# Patient Record
Sex: Male | Born: 1965 | Race: White | Hispanic: No | Marital: Single | Smoking: Former smoker
Health system: Southern US, Community
[De-identification: ages and names within clinical notes are randomized; demographics above are authoritative.]

## PROBLEM LIST (undated history)

## (undated) DIAGNOSIS — G4733 Obstructive sleep apnea (adult) (pediatric): Secondary | ICD-10-CM

## (undated) DIAGNOSIS — I4892 Unspecified atrial flutter: Secondary | ICD-10-CM

## (undated) DIAGNOSIS — Z720 Tobacco use: Secondary | ICD-10-CM

## (undated) DIAGNOSIS — I10 Essential (primary) hypertension: Secondary | ICD-10-CM

## (undated) HISTORY — DX: Obstructive sleep apnea (adult) (pediatric): G47.33

## (undated) HISTORY — DX: Tobacco use: Z72.0

## (undated) HISTORY — PX: CHOLECYSTECTOMY: SHX55

## (undated) HISTORY — DX: Unspecified atrial flutter: I48.92

---

## 2001-02-17 ENCOUNTER — Ambulatory Visit (HOSPITAL_COMMUNITY): Admission: RE | Admit: 2001-02-17 | Discharge: 2001-02-17 | Payer: Self-pay | Admitting: Orthopedic Surgery

## 2001-02-17 ENCOUNTER — Encounter: Payer: Self-pay | Admitting: Orthopedic Surgery

## 2014-05-01 ENCOUNTER — Encounter (HOSPITAL_COMMUNITY): Payer: Self-pay | Admitting: Emergency Medicine

## 2014-05-01 ENCOUNTER — Emergency Department (HOSPITAL_COMMUNITY): Payer: BC Managed Care – PPO

## 2014-05-01 ENCOUNTER — Emergency Department (HOSPITAL_COMMUNITY)
Admission: EM | Admit: 2014-05-01 | Discharge: 2014-05-01 | Disposition: A | Discharge status: Home / Self Care | Payer: BC Managed Care – PPO | Attending: Emergency Medicine | Admitting: Emergency Medicine

## 2014-05-01 ENCOUNTER — Emergency Department (INDEPENDENT_AMBULATORY_CARE_PROVIDER_SITE_OTHER)
Admission: EM | Admit: 2014-05-01 | Discharge: 2014-05-01 | Disposition: A | Discharge status: Nursing Home (Includes ICF) | Payer: BC Managed Care – PPO | Source: Home / Self Care | Attending: Emergency Medicine | Admitting: Emergency Medicine

## 2014-05-01 DIAGNOSIS — F17200 Nicotine dependence, unspecified, uncomplicated: Secondary | ICD-10-CM | POA: Insufficient documentation

## 2014-05-01 DIAGNOSIS — R079 Chest pain, unspecified: Secondary | ICD-10-CM

## 2014-05-01 DIAGNOSIS — I10 Essential (primary) hypertension: Secondary | ICD-10-CM | POA: Insufficient documentation

## 2014-05-01 DIAGNOSIS — E669 Obesity, unspecified: Secondary | ICD-10-CM | POA: Insufficient documentation

## 2014-05-01 HISTORY — DX: Essential (primary) hypertension: I10

## 2014-05-01 LAB — CBC
HCT: 41.4 % (ref 39.0–52.0)
Hemoglobin: 14 g/dL (ref 13.0–17.0)
MCH: 29.7 pg (ref 26.0–34.0)
MCHC: 33.8 g/dL (ref 30.0–36.0)
MCV: 87.9 fL (ref 78.0–100.0)
Platelets: 205 10*3/uL (ref 150–400)
RBC: 4.71 MIL/uL (ref 4.22–5.81)
RDW: 12.8 % (ref 11.5–15.5)
WBC: 9.6 10*3/uL (ref 4.0–10.5)

## 2014-05-01 LAB — BASIC METABOLIC PANEL
BUN: 16 mg/dL (ref 6–23)
CO2: 25 mEq/L (ref 19–32)
Calcium: 9 mg/dL (ref 8.4–10.5)
Chloride: 102 mEq/L (ref 96–112)
Creatinine, Ser: 0.74 mg/dL (ref 0.50–1.35)
GFR calc non Af Amer: >90 mL/min (ref >90)
Glucose, Bld: 103 mg/dL — ABNORMAL HIGH (ref 70–99)
POTASSIUM: 4.2 meq/L (ref 3.7–5.3)
SODIUM: 140 meq/L (ref 137–147)

## 2014-05-01 LAB — I-STAT TROPONIN, ED: Troponin i, poc: 0 ng/mL (ref 0.00–0.08)

## 2014-05-01 LAB — TROPONIN I: Troponin I: <0.3 ng/mL (ref <0.30)

## 2014-05-01 MED ORDER — SODIUM CHLORIDE 0.9 % IV SOLN
INTRAVENOUS | Status: DC
  Administered 2014-05-01: 18:00:00 via INTRAVENOUS

## 2014-05-01 MED ORDER — NITROGLYCERIN 0.4 MG SL SUBL
0.4000 mg | SUBLINGUAL_TABLET | SUBLINGUAL | Status: AC | PRN
  Administered 2014-05-01: 0.4 mg via SUBLINGUAL

## 2014-05-01 MED ORDER — NITROGLYCERIN 0.4 MG SL SUBL
SUBLINGUAL_TABLET | SUBLINGUAL | Status: AC
  Filled 2014-05-01: qty 1

## 2014-05-01 MED ORDER — NITROGLYCERIN 0.4 MG SL SUBL
0.4000 mg | SUBLINGUAL_TABLET | SUBLINGUAL | Status: DC | PRN
  Administered 2014-05-01: 0.4 mg via SUBLINGUAL
  Filled 2014-05-01: qty 1

## 2014-05-01 MED ORDER — ASPIRIN 81 MG PO CHEW
162.0000 mg | CHEWABLE_TABLET | Freq: Once | ORAL | Status: AC
  Administered 2014-05-01: 162 mg via ORAL

## 2014-05-01 MED ORDER — ASPIRIN 81 MG PO CHEW
CHEWABLE_TABLET | ORAL | Status: AC
  Filled 2014-05-01: qty 2

## 2014-05-01 MED ORDER — MORPHINE SULFATE 4 MG/ML IJ SOLN
4.0000 mg | Freq: Once | INTRAMUSCULAR | Status: AC
  Administered 2014-05-01: 4 mg via INTRAVENOUS
  Filled 2014-05-01: qty 1

## 2014-05-01 NOTE — ED Provider Notes (Signed)
Chief Complaint   Chief Complaint  Patient presents with  . Chest Pain    History of Present Illness    Tim [name] is a 48 year old male with high blood pressure who has had left pectoral chest pain since 5 AM this morning. The pain has been constant and does not radiate. It feels like a tight, squeezing pain rated 3/10 in intensity it has not been accompanied by nausea, diaphoresis, or shortness of breath. Nothing makes it better or worse. The pain is not pleuritic. It's not worse with position, exertion, activity, or meals. He denies any fever, chills, headache, neck pain, upper back pain, coughing, wheezing, palpitations, dizziness, or syncope. He's had no abdominal pain, nausea, vomiting, or diarrhea. No reflux or indigestion. He's never had pain like this before. He has no history of cardiac disease. There is no family history of heart disease, no diabetes, hypercholesterolemia, or cigarette smoking. He has no history of leg pain or swelling and no history of DVT or pulmonary embolism.  Review of Systems    Other than noted above, the patient denies any of the following symptoms. Systemic:  No fever or chills. Pulmonary:  No cough, wheezing, shortness of breath, sputum production, hemoptysis. Cardiac:  No palpitations, rapid heartbeat, dizziness, presyncope or syncope. GI:  No abdominal pain, heartburn, nausea, or vomiting. Ext:  No leg pain or swelling.  PMFSH    Past medical history, family history, social history, meds, and allergies were reviewed. He has high blood pressure and takes Norvasc and lisinopril.  Physical Exam     Vital signs:  BP 150/93  Pulse 66  Temp(Src) 98.7 F (37.1 C) (Oral)  Resp 19  SpO2 98% Gen:  Alert, oriented, in no distress, skin warm and dry. Eye:  PERRL, lids and conjunctivas normal.  Sclera non-icteric. ENT:  Mucous membranes moist, pharynx clear. Neck:  Supple, no adenopathy or tenderness.  No JVD. Lungs:  Clear to auscultation, no  wheezes, rales or rhonchi.  No respiratory distress. Heart:  Regular rhythm.  No gallops, murmers, clicks or rubs. Chest:  No chest wall tenderness. Abdomen:  Soft, nontender, no organomegaly or mass.  Bowel sounds normal.  No pulsatile abdominal mass or bruit. Ext:  No edema.  No calf tenderness and Homann's sign negative.  Pulses full and equal. Skin:  Warm and dry.  No rash.  EKG Results:  Date: 05/01/2014  Rate: 63  Rhythm: normal sinus rhythm  QRS Axis: normal  Intervals: normal  ST/T Wave abnormalities: normal  Conduction Disutrbances:none  Narrative Interpretation: Normal sinus rhythm, normal EKG.  Old EKG Reviewed: none available    Course in Urgent Care Center         He was given nitroglycerin 0.4 mg sublingually, aspirin 162 mg (he had taken 2 baby aspirin at home), begun on IV normal saline, cardiac monitor, and nasal O2.  Assessment     The encounter diagnosis was Chest pain.  Differential diagnosis is acute coronary syndrome, pulmonary embolism, ruptured aneurysm, pneumothorax, Boerhaave syndrome, pericarditis, musculoskeletal pain, or reflux esophagitis.  Plan     The patient was transferred to the ED via [facility] in stable condition.  Medical Decision Making:  48 year old male with HT has had left pectoral chest pain since 5 a.m. This morning without radiation.  No associated nausea, diaphoresis or shortness of breath.  Not pleuritic.  No cardiac history.  EKG is normal.  Will start IV, give O2. TNG and ASA and transport via [facility].       Reuben Likesavid C Mateo Overbeck, MD 05/01/14 [id number]

## 2014-05-01 NOTE — ED Notes (Signed)
Patient complains of chest pain that started this morning.

## 2014-05-01 NOTE — Discharge Instructions (Signed)
Take aspirin 325 mg daily until you're cleared by cardiology. Call the cardiology office at [phone] tomorrow morning to arrange a stress test.  If you were given medicines take as directed.  If you are on coumadin or contraceptives realize their levels and effectiveness is altered by many different medicines.  If you have any reaction (rash, tongues swelling, other) to the medicines stop taking and see a physician.   Please follow up as directed and return to the ER or see a physician for new or worsening symptoms.  Thank you. Filed Vitals:   05/01/14 2000 05/01/14 2015 05/01/14 2030 05/01/14 2056  BP: 132/73 126/65 129/72 144/69  Pulse: 52 57 62 54  Temp:      TempSrc:      Resp: 14 13 12 14   SpO2: 98% 99% 97% 96%    Chest Pain (Nonspecific) It is often hard to give a specific diagnosis for the cause of chest pain. There is always a chance that your pain could be related to something serious, such as a heart attack or a blood clot in the lungs. You need to follow up with your caregiver for further evaluation. CAUSES   Heartburn.  Pneumonia or bronchitis.  Anxiety or stress.  Inflammation around your heart (pericarditis) or lung (pleuritis or pleurisy).  A blood clot in the lung.  A collapsed lung (pneumothorax). It can develop suddenly on its own (spontaneous pneumothorax) or from injury (trauma) to the chest.  Shingles infection (herpes zoster virus). The chest wall is composed of bones, muscles, and cartilage. Any of these can be the source of the pain.  The bones can be bruised by injury.  The muscles or cartilage can be strained by coughing or overwork.  The cartilage can be affected by inflammation and become sore (costochondritis). DIAGNOSIS  Lab tests or other studies, such as X-rays, electrocardiography, stress testing, or cardiac imaging, may be needed to find the cause of your pain.  TREATMENT   Treatment depends on what may be causing your chest pain. Treatment  may include:  Acid blockers for heartburn.  Anti-inflammatory medicine.  Pain medicine for inflammatory conditions.  Antibiotics if an infection is present.  You may be advised to change lifestyle habits. This includes stopping smoking and avoiding alcohol, caffeine, and chocolate.  You may be advised to keep your head raised (elevated) when sleeping. This reduces the chance of acid going backward from your stomach into your esophagus.  Most of the time, nonspecific chest pain will improve within 2 to 3 days with rest and mild pain medicine. HOME CARE INSTRUCTIONS   If antibiotics were prescribed, take your antibiotics as directed. Finish them even if you start to feel better.  For the next few days, avoid physical activities that bring on chest pain. Continue physical activities as directed.  Do not smoke.  Avoid drinking alcohol.  Only take over-the-counter or prescription medicine for pain, discomfort, or fever as directed by your caregiver.  Follow your caregiver's suggestions for further testing if your chest pain does not go away.  Keep any follow-up appointments you made. If you do not go to an appointment, you could develop lasting (chronic) problems with pain. If there is any problem keeping an appointment, you must call to reschedule. SEEK MEDICAL CARE IF:   You think you are having problems from the medicine you are taking. Read your medicine instructions carefully.  Your chest pain does not go away, even after treatment.  You develop a rash with  blisters on your chest. SEEK IMMEDIATE MEDICAL CARE IF:   You have increased chest pain or pain that spreads to your arm, neck, jaw, back, or abdomen.  You develop shortness of breath, an increasing cough, or you are coughing up blood.  You have severe back or abdominal pain, feel nauseous, or vomit.  You develop severe weakness, fainting, or chills.  You have a fever. THIS IS AN EMERGENCY. Do not wait to see if the  pain will go away. Get medical help at once. Call your local emergency services (911 in [place] Do not drive yourself to the hospital. MAKE SURE YOU:   Understand these instructions.  Will watch your condition.  Will get help right away if you are not doing well or get worse. Document Released: 08/14/2005 Document Revised: 01/27/2012 Document Reviewed: 06/09/2008 [facility] Patient Information 2014 [place], [place].

## 2014-05-01 NOTE — ED Notes (Signed)
Phlebotomy at bedside.

## 2014-05-01 NOTE — Discharge Instructions (Signed)
We have determined that your problem requires further evaluation in the emergency department.  We will take care of your transport there.  Once at the emergency department, you will be evaluated by a provider and they will order whatever treatment or tests they deem necessary.  We cannot guarantee that they will do any specific test or do any specific treatment.  ° °

## 2014-05-01 NOTE — ED Notes (Addendum)
Per EMS- [facility]/o substernal CP since 0500 this morning. No radiation or sob, or n/v. Given 324 aspirin, 1 nitro was painfree after. Currently reports 1/10 cp left sided. Pt is a x 4. Vss. EKG SR.

## 2014-05-01 NOTE — ED Provider Notes (Signed)
CSN: [id number]     Arrival date & time 05/01/14  1801 History   First MD Initiated Contact with Patient 05/01/14 1807     Chief Complaint  Patient presents with  . Chest Pain     (Consider location/radiation/quality/duration/timing/severity/associated sxs/prior Treatment) HPI Comments: 48 year old male who with history of smoking, high blood pressure and obesity presents with chest tightness fairly constant since this morning. Nothing specific worsens or improves his symptoms except nitroglycerin had mild improvement. Patient is very mild chest tightness currently. He denies any known cardiac history or recent stress test. No significant family history. Patient had aspirin and sent over from urgent care.Patient denies blood clot history, active cancer, recent major trauma or surgery, unilateral leg swelling/ pain, recent long travel, hemoptysis or oral contraceptives. No recent exertional symptoms or diaphoresis. Nonradiating anterior.  Patient is a 48 y.o. male presenting with chest pain. The history is provided by the patient.  Chest Pain Associated symptoms: no abdominal pain, no back pain, no cough, no fever, no headache, no shortness of breath and not vomiting     Past Medical History  Diagnosis Date  . Hypertension    Past Surgical History  Procedure Laterality Date  . Cholecystectomy     No family history on file. History  Substance Use Topics  . Smoking status: Light Tobacco Smoker    Types: Cigars  . Smokeless tobacco: Not on file  . Alcohol Use: Yes     Comment: occ    Review of Systems  Constitutional: Negative for fever and chills.  HENT: Negative for congestion.   Eyes: Negative for visual disturbance.  Respiratory: Negative for cough and shortness of breath.   Cardiovascular: Positive for chest pain.  Gastrointestinal: Negative for vomiting and abdominal pain.  Genitourinary: Negative for dysuria and flank pain.  Musculoskeletal: Negative for back pain, neck  pain and neck stiffness.  Skin: Negative for rash.  Neurological: Negative for light-headedness and headaches.      Allergies  Review of patient's allergies indicates no known allergies.  Home Medications   Prior to Admission medications   Not on File   BP 124/74  Pulse 65  Temp(Src) 98.4 F (36.9 C) (Oral)  Resp 22  SpO2 99% Physical Exam  Nursing note and vitals reviewed. Constitutional: He is oriented to person, place, and time. He appears well-developed and well-nourished.  HENT:  Head: Normocephalic and atraumatic.  Eyes: Conjunctivae are normal. Right eye exhibits no discharge. Left eye exhibits no discharge.  Neck: Normal range of motion. Neck supple. No tracheal deviation present.  Cardiovascular: Normal rate, regular rhythm and intact distal pulses.   Pulmonary/Chest: Effort normal and breath sounds normal.  Abdominal: Soft. He exhibits no distension. There is no tenderness. There is no guarding.  Musculoskeletal: He exhibits no edema and no tenderness.  Neurological: He is alert and oriented to person, place, and time.  Skin: Skin is warm. No rash noted.  Psychiatric: He has a normal mood and affect.    ED Course  Procedures (including critical care time) Labs Review Labs Reviewed  BASIC METABOLIC PANEL - Abnormal; Notable for the following:    Glucose, Bld 103 (*)    All other components within normal limits  CBC  TROPONIN I  TROPONIN I  [name], ED    Imaging Review Dg Chest 2 View  05/01/2014   CLINICAL DATA:  Left chest pain.  Hypertension.  EXAM: CHEST  2 VIEW  COMPARISON:  None.  FINDINGS: Mild peribronchial thickening.  Heart and mediastinal contours are within normal limits. No focal opacities or effusions. No acute bony abnormality.  IMPRESSION: Mild bronchitic changes.   Electronically Signed   By: Charlett NoseKevin  Dover M.D.   On: 05/01/2014 19:04     EKG Interpretation   Date/Time:  Sunday May 01 2014 18:06:59 EDT Ventricular Rate:   60 PR Interval:  162 QRS Duration: 95 QT Interval:  414 QTC Calculation: 414 R Axis:   28 Text Interpretation:  Sinus rhythm Low voltage, precordial leads Confirmed  by Talajah Slimp  MD, Tashia Leiterman ([id number]) on 05/01/2014 6:24:37 PM Also confirmed by  Jodi MourningZAVITZ  MD, Adylene Dlugosz ([id number])  on 05/01/2014 9:23:03 PM      MDM   Final diagnoses:  Chest pain     patient with 3 cardiac risk factors presents with chest tightness constant all day. Patient well-appearing with mild symptoms on exam plan for cardiac workup. Patient very low risk blood clot and I do not feel this workup indicated at this time. Discussed plan for observation hospital versus serial troponins in the ER and close follow for stress test the patient.    Patient's heart score is 3. I spoke with Dr. Aleene DavidsonGAN JI will see patient in the next 40 hours during stress test in reassessment. I discussed observation hospital versus close outpatient follow the patient and he prefers to follow closely outpatient and understands reasons return. Patient's capacity to make decisions. Patient's pain free on recheck and second troponin negative  Results and differential diagnosis were discussed with the patient/parent/guardian. Close follow up outpatient was discussed, comfortable with the plan.   Medications  nitroGLYCERIN (NITROSTAT) SL tablet 0.4 mg (0.4 mg Sublingual Given 05/01/14 1926)  morphine 4 MG/ML injection 4 mg (4 mg Intravenous Given 05/01/14 1842)    Filed Vitals:   05/01/14 2000 05/01/14 2015 05/01/14 2030 05/01/14 2056  BP: 132/73 126/65 129/72 144/69  Pulse: 52 57 62 54  Temp:      TempSrc:      Resp: 14 13 12 14   SpO2: 98% 99% 97% 96%       Enid SkeensJoshua M Kursten Kruk, MD 05/01/14 2134

## 2015-05-27 IMAGING — CR DG CHEST 2V
2 series · 2 of 2 positions shown · non-contrast
Comparison: None.

CLINICAL DATA: Left chest pain.  Hypertension.

EXAM:
CHEST  2 VIEW

[w chest pa]
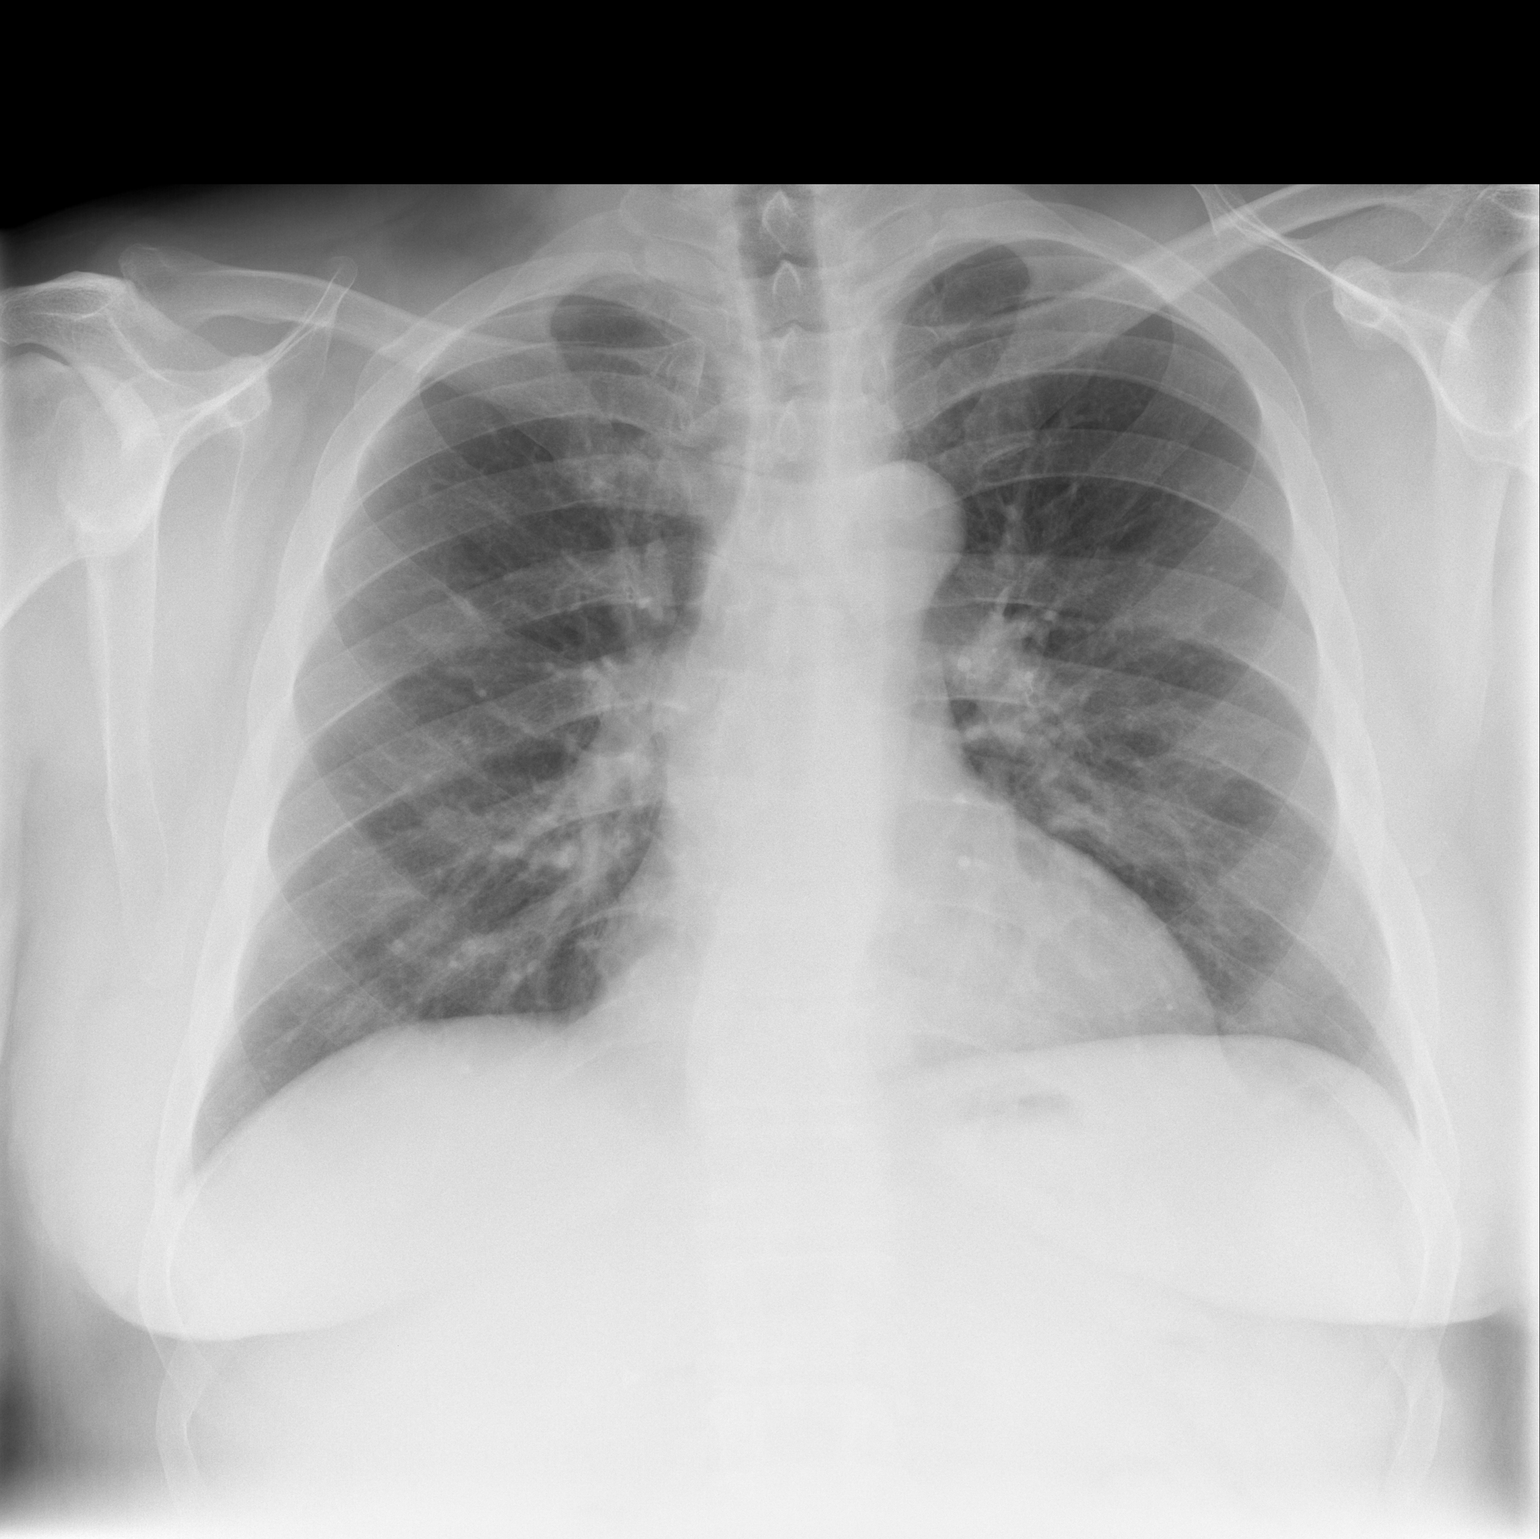

[w chest lat]
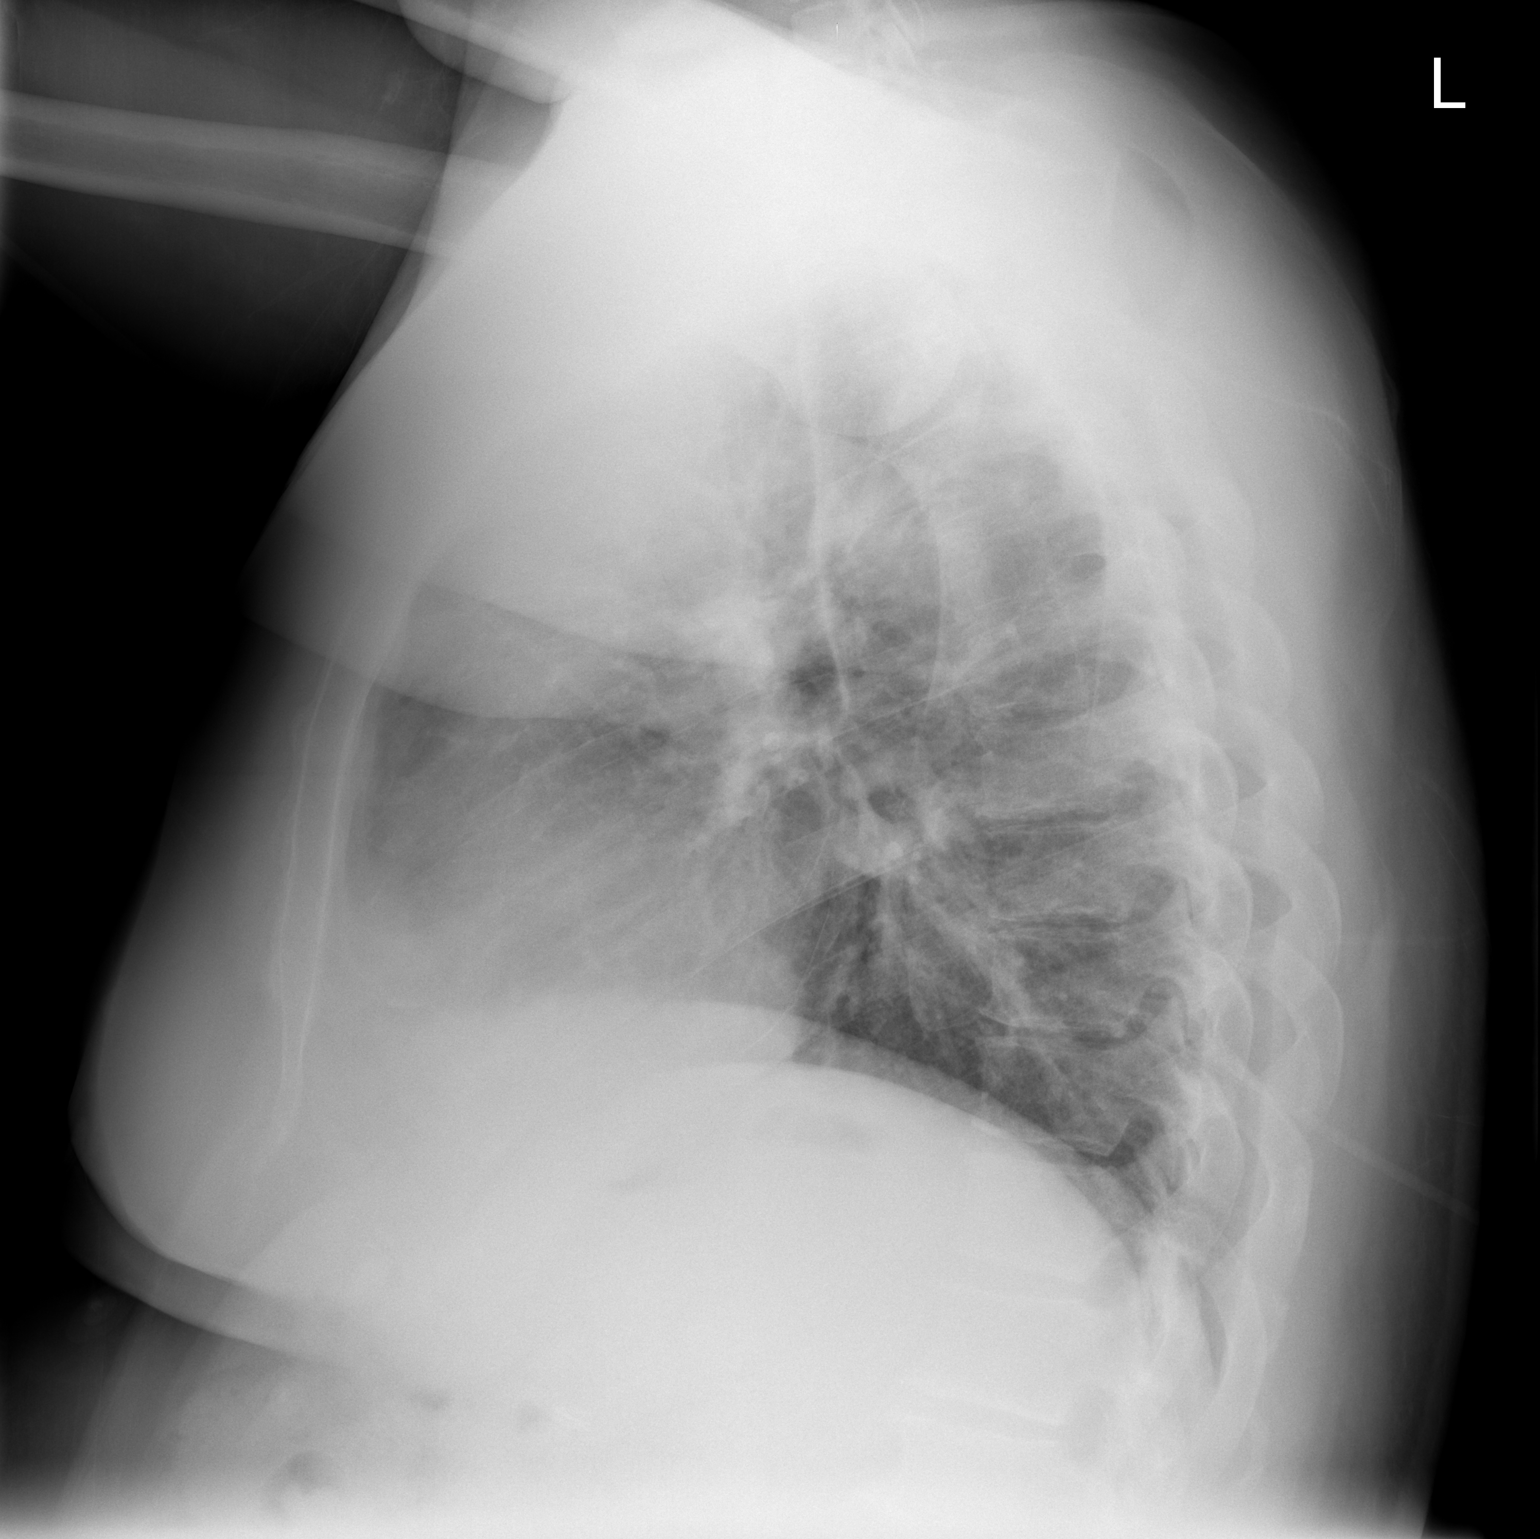

[2 of 2 positions shown; findings below may reference images not displayed]

FINDINGS: Mild peribronchial thickening. Heart and mediastinal contours are
within normal limits. No focal opacities or effusions. No acute bony
abnormality.
IMPRESSION: Mild bronchitic changes.

## 2019-12-12 DIAGNOSIS — G4733 Obstructive sleep apnea (adult) (pediatric): Secondary | ICD-10-CM | POA: Diagnosis present

## 2019-12-12 DIAGNOSIS — M5136 Other intervertebral disc degeneration, lumbar region: Secondary | ICD-10-CM | POA: Insufficient documentation

## 2023-04-08 ENCOUNTER — Other Ambulatory Visit: Payer: Self-pay

## 2023-04-08 ENCOUNTER — Observation Stay (HOSPITAL_COMMUNITY)
Admission: EM | Admit: 2023-04-08 | Discharge: 2023-04-09 | Disposition: A | Discharge status: Home / Self Care | Payer: 59 | Attending: Family Medicine | Admitting: Family Medicine

## 2023-04-08 ENCOUNTER — Emergency Department (HOSPITAL_COMMUNITY): Payer: 59

## 2023-04-08 ENCOUNTER — Encounter (HOSPITAL_COMMUNITY): Payer: Self-pay

## 2023-04-08 DIAGNOSIS — Z79899 Other long term (current) drug therapy: Secondary | ICD-10-CM | POA: Diagnosis not present

## 2023-04-08 DIAGNOSIS — Z1152 Encounter for screening for COVID-19: Secondary | ICD-10-CM | POA: Diagnosis not present

## 2023-04-08 DIAGNOSIS — I4891 Unspecified atrial fibrillation: Secondary | ICD-10-CM | POA: Diagnosis not present

## 2023-04-08 DIAGNOSIS — I10 Essential (primary) hypertension: Secondary | ICD-10-CM | POA: Insufficient documentation

## 2023-04-08 DIAGNOSIS — F17290 Nicotine dependence, other tobacco product, uncomplicated: Secondary | ICD-10-CM | POA: Insufficient documentation

## 2023-04-08 DIAGNOSIS — G4733 Obstructive sleep apnea (adult) (pediatric): Secondary | ICD-10-CM | POA: Diagnosis present

## 2023-04-08 LAB — CBC
HCT: 50 % (ref 39.0–52.0)
Hemoglobin: 17 g/dL (ref 13.0–17.0)
MCH: 30.9 pg (ref 26.0–34.0)
MCHC: 34 g/dL (ref 30.0–36.0)
MCV: 90.9 fL (ref 80.0–100.0)
Platelets: 260 10*3/uL (ref 150–400)
RBC: 5.5 MIL/uL (ref 4.22–5.81)
RDW: 12.6 % (ref 11.5–15.5)
WBC: 10.2 10*3/uL (ref 4.0–10.5)
nRBC: 0 % (ref 0.0–0.2)

## 2023-04-08 LAB — TROPONIN I (HIGH SENSITIVITY)
Troponin I (High Sensitivity): 14 ng/L (ref <18)
Troponin I (High Sensitivity): 16 ng/L (ref <18)

## 2023-04-08 LAB — BASIC METABOLIC PANEL
Anion gap: 14 (ref 5–15)
BUN: 20 mg/dL (ref 6–20)
CO2: 23 mmol/L (ref 22–32)
Calcium: 9.3 mg/dL (ref 8.9–10.3)
Chloride: 100 mmol/L (ref 98–111)
Creatinine, Ser: 1 mg/dL (ref 0.61–1.24)
GFR, Estimated: >60 mL/min (ref >60)
Glucose, Bld: 129 mg/dL — ABNORMAL HIGH (ref 70–99)
Potassium: 3.8 mmol/L (ref 3.5–5.1)
Sodium: 137 mmol/L (ref 135–145)

## 2023-04-08 LAB — SARS CORONAVIRUS 2 BY RT PCR: SARS Coronavirus 2 by RT PCR: NEGATIVE

## 2023-04-08 LAB — BRAIN NATRIURETIC PEPTIDE: B Natriuretic Peptide: 141.9 pg/mL — ABNORMAL HIGH (ref 0.0–100.0)

## 2023-04-08 MED ORDER — HEPARIN (PORCINE) 25000 UT/250ML-% IV SOLN
1800.0000 [IU]/h | INTRAVENOUS | Status: DC
  Administered 2023-04-08: 1600 [IU]/h via INTRAVENOUS
  Administered 2023-04-09: 1800 [IU]/h via INTRAVENOUS
  Filled 2023-04-08 (×2): qty 250

## 2023-04-08 MED ORDER — ASPIRIN 81 MG PO CHEW
324.0000 mg | CHEWABLE_TABLET | Freq: Once | ORAL | Status: AC
  Administered 2023-04-08: 324 mg via ORAL
  Filled 2023-04-08: qty 4

## 2023-04-08 MED ORDER — HEPARIN BOLUS VIA INFUSION
6000.0000 [IU] | Freq: Once | INTRAVENOUS | Status: AC
  Administered 2023-04-08: 6000 [IU] via INTRAVENOUS
  Filled 2023-04-08: qty 6000

## 2023-04-08 MED ORDER — DILTIAZEM HCL-DEXTROSE 125-5 MG/125ML-% IV SOLN (PREMIX)
5.0000 mg/h | INTRAVENOUS | Status: DC
  Administered 2023-04-08: 5 mg/h via INTRAVENOUS
  Filled 2023-04-08: qty 125

## 2023-04-08 MED ORDER — DILTIAZEM LOAD VIA INFUSION
20.0000 mg | Freq: Once | INTRAVENOUS | Status: AC
  Administered 2023-04-08: 20 mg via INTRAVENOUS
  Filled 2023-04-08: qty 20

## 2023-04-08 NOTE — ED Triage Notes (Signed)
Pt c/o midsternal chest pain and SOB started at 0900. Pt states his apple watch told him he was in A-fib and tachy at 156 and pt doesn't have a hx of it.

## 2023-04-08 NOTE — ED Provider Notes (Signed)
EMERGENCY DEPARTMENT AT [facility] Provider Note   CSN: [id number] Arrival date & time: 04/08/23  1741     History Chief Complaint  Patient presents with   Atrial Fibrillation    HPI Tim [name] is a 57 y.o. male presenting for 3 days of palpitations.  States that he has had palpitations in the past but they are typically not this severe.  Today they started causing this dyspnea sensation and shortness of breath as well as mild chest discomfort. Has had a workup for this and wore a heart monitor that was nondiagnostic.  Out in triage patient was tachycardic to the 150s brought back for emergent care.  Endorses a history of hypertension on amlodipine/lisinopril.   Patient's recorded medical, surgical, social, medication list and allergies were reviewed in the Snapshot window as part of the initial history.   Review of Systems   Review of Systems  Constitutional:  Negative for chills and fever.  HENT:  Negative for ear pain and sore throat.   Eyes:  Negative for pain and visual disturbance.  Respiratory:  Positive for shortness of breath. Negative for cough.   Cardiovascular:  Positive for palpitations. Negative for chest pain.  Gastrointestinal:  Negative for abdominal pain and vomiting.  Genitourinary:  Negative for dysuria and hematuria.  Musculoskeletal:  Negative for arthralgias and back pain.  Skin:  Negative for color change and rash.  Neurological:  Negative for seizures and syncope.  All other systems reviewed and are negative.   Physical Exam Updated Vital Signs BP 108/77   Pulse 68   Temp 97.7 F (36.5 C) (Oral)   Resp 15   Ht 6\' 3"  (1.905 m)   Wt 136.1 kg   SpO2 99%   BMI 37.50 kg/m  Physical Exam Vitals and nursing note reviewed.  Constitutional:      General: He is not in acute distress.    Appearance: He is well-developed.  HENT:     Head: Normocephalic and atraumatic.  Eyes:     Conjunctiva/sclera: Conjunctivae normal.   Cardiovascular:     Rate and Rhythm: Normal rate and regular rhythm.     Heart sounds: No murmur heard. Pulmonary:     Effort: Pulmonary effort is normal. No respiratory distress.     Breath sounds: Normal breath sounds.  Abdominal:     Palpations: Abdomen is soft.     Tenderness: There is no abdominal tenderness.  Musculoskeletal:        General: No swelling.     Cervical back: Neck supple.  Skin:    General: Skin is warm and dry.     Capillary Refill: Capillary refill takes less than 2 seconds.  Neurological:     Mental Status: He is alert.  Psychiatric:        Mood and Affect: Mood normal.      ED Course/ Medical Decision Making/ A&P    Procedures .Critical Care  Performed by: Glyn Ade, MD Authorized by: Glyn Ade, MD   Critical care provider statement:    Critical care time (minutes):  30   Critical care was necessary to treat or prevent imminent or life-threatening deterioration of the following conditions:  Cardiac failure   Critical care was time spent personally by me on the following activities:  Development of treatment plan with patient or surrogate, discussions with consultants, evaluation of patient's response to treatment, examination of patient, ordering and review of laboratory studies, ordering and review of radiographic studies, ordering  and performing treatments and interventions, pulse oximetry, re-evaluation of patient's condition and review of old charts   Care discussed with: admitting provider   Ultrasound ED Echo  Date/Time: 04/08/2023 6:05 PM  Performed by: Glyn Ade, MD Authorized by: Glyn Ade, MD   Procedure details:    Indications: chest pain     Views: subxiphoid, parasternal long axis view, parasternal short axis view and apical 4 chamber view     Images: archived     Limitations:  Body habitus, acoustic shadowing, patient compliance, positioning and increased thoracic air Findings:    LV Function:  normal (>50% EF)   Impression:    Impression: normal     Impression comment:  Very limited exam due to patient's obesity and difficulty of finding adequate windows, however brief glimpse that show a dynamic left ventricle.  No identified pericardial effusion.    Medications Ordered in ED Medications  diltiazem (CARDIZEM) 1 mg/mL load via infusion 20 mg (20 mg Intravenous Bolus from Bag 04/08/23 1815)    And  diltiazem (CARDIZEM) 125 mg in dextrose 5% 125 mL (1 mg/mL) infusion (5 mg/hr Intravenous New Bag/Given 04/08/23 1812)  heparin ADULT infusion 100 units/mL (25000 units/271mL) (1,600 Units/hr Intravenous New Bag/Given 04/08/23 1842)  aspirin chewable tablet 324 mg (324 mg Oral Given 04/08/23 1809)  heparin bolus via infusion 6,000 Units (6,000 Units Intravenous Bolus from Bag 04/08/23 1842)    Medical Decision Making:    [name] is a 57 y.o. male who presented to the ED today with lightheadedness detailed above.     Patient's presentation is complicated by their history of multiple comorbid medical problems.  Patient placed on continuous vitals and telemetry monitoring while in ED which was reviewed periodically.   Complete initial physical exam performed, notably the patient  was hemodynamically stable no acute distress.      Reviewed and confirmed nursing documentation for past medical history, family history, social history.    Initial Assessment:   Patient's history of present on his physicals and findings are concerning for atrial fibrillation with rapid ventricular response to the 150s.  He is having presyncope and palpitations. Consulted cardiology based on this presentation.  They agree with diltiazem infusion and heparinization due to possible need for cardioversion. Patient tolerated diltiazem initiation well.  Laboratory evaluation reveals no underlying metabolic or infectious etiology of his symptoms.  Will admit to medicine for further diagnostic care management and  titration over to oral agents.  Disposition:   Based on the above findings, I believe this patient is stable for admission.    Patient/family educated about specific findings on our evaluation and explained exact reasons for admission.  Patient/family educated about clinical situation and time was allowed to answer questions.   Admission team communicated with and agreed with need for admission. Patient admitted. Patient ready to move at this time.     Emergency Department Medication Summary:   Medications  diltiazem (CARDIZEM) 1 mg/mL load via infusion 20 mg (20 mg Intravenous Bolus from Bag 04/08/23 1815)    And  diltiazem (CARDIZEM) 125 mg in dextrose 5% 125 mL (1 mg/mL) infusion (5 mg/hr Intravenous New Bag/Given 04/08/23 1812)  heparin ADULT infusion 100 units/mL (25000 units/231mL) (1,600 Units/hr Intravenous New Bag/Given 04/08/23 1842)  aspirin chewable tablet 324 mg (324 mg Oral Given 04/08/23 1809)  heparin bolus via infusion 6,000 Units (6,000 Units Intravenous Bolus from Bag 04/08/23 1842)         Clinical Impression:  1. Atrial fibrillation  with RVR (HCC)      Admit   Final Clinical Impression(s) / ED Diagnoses Final diagnoses:  Atrial fibrillation with RVR [facility])    Rx / DC Orders ED Discharge Orders     None         Glyn Ade, MD 04/08/23 2316

## 2023-04-08 NOTE — Progress Notes (Signed)
ANTICOAGULATION CONSULT NOTE - Initial Consult  Pharmacy Consult for heparin Indication: atrial fibrillation  No Known Allergies  Patient Measurements: Height: 6\' 3"  (190.5 cm) Weight: 136.1 kg (300 lb) IBW/kg (Calculated) : 84.5 Heparin Dosing Weight: 115kg  Vital Signs: Temp: 99.1 F (37.3 C) (05/21 1749) BP: 128/83 (05/21 1749) Pulse Rate: 78 (05/21 1749)  Labs: No results for input(s): "HGB", "HCT", "PLT", "APTT", "LABPROT", "INR", "HEPARINUNFRC", "HEPRLOWMOCWT", "CREATININE", "CKTOTAL", "CKMB", "TROPONINIHS" in the last 72 hours.  CrCl cannot be calculated (Patient's most recent lab result is older than the maximum 21 days allowed.).   Medical History: Past Medical History:  Diagnosis Date   Hypertension      Assessment: 55 YOM presenting with CP and SOB, in Afib, he is not on anticoagulation PTA  Goal of Therapy:  Heparin level 0.3-0.7 units/ml Monitor platelets by anticoagulation protocol: Yes   Plan:  Heparin 6000 units IV x 1, and gtt at 1600 units/hr F/u 6 hour heparin level F/u long term [facility] plan  Daylene Posey, PharmD, [facility] Clinical Pharmacist ED Pharmacist Phone # [phone] 04/08/2023 6:22 PM

## 2023-04-08 NOTE — ED Notes (Signed)
ED TO INPATIENT HANDOFF REPORT  ED Nurse Name and Phone #:[phone]  S Name/Age/Gender Tim [name] 57 y.o. male Room/Bed: TRAAC/TRAAC  Code Status   Code Status: Full Code  Home/SNF/Other Home Patient oriented to: self, place, time, and situation Is this baseline? Yes   Triage Complete: Triage complete  Chief Complaint Atrial fibrillation with RVR (HCC) [I48.91]  Triage Note Pt c/o midsternal chest pain and SOB started at 0900. Pt states his apple watch told him he was in A-fib and tachy at 156 and pt doesn't have a hx of it.    Allergies No Known Allergies  Level of Care/Admitting Diagnosis ED Disposition     ED Disposition  Admit   Condition  --   Comment  Hospital Area: [facility] [[id number]]  Level of Care: Progressive [102]  Admit to Progressive based on following criteria: CARDIOVASCULAR & THORACIC of moderate stability with acute coronary syndrome symptoms/low risk myocardial infarction/hypertensive urgency/arrhythmias/heart failure potentially compromising stability and stable post cardiovascular intervention patients.  May admit patient to [facility] or [facility] if equivalent level of care is available:: No  Covid Evaluation: Asymptomatic - no recent exposure (last 10 days) testing not required  Diagnosis: Atrial fibrillation with RVR [facility]) [161096]  Admitting Physician: Buena Irish [[id number]]  Attending Physician: Buena Irish [phone]  Certification:: I certify this patient will need inpatient services for at least 2 midnights  Estimated Length of Stay: 2          B Medical/Surgery History Past Medical History:  Diagnosis Date   Hypertension    Past Surgical History:  Procedure Laterality Date   CHOLECYSTECTOMY       A IV Location/Drains/Wounds Patient Lines/Drains/Airways Status     Active Line/Drains/Airways     Name Placement date Placement time Site Days   Peripheral IV 04/08/23 18 G Left Antecubital 04/08/23   1805  Antecubital  less than 1   Peripheral IV 04/08/23 20 G Right Antecubital 04/08/23  1910  Antecubital  less than 1            Intake/Output Last 24 hours No intake or output data in the 24 hours ending 04/08/23 2020  Labs/Imaging Results for orders placed or performed during the hospital encounter of 04/08/23 (from the past 48 hour(s))  Basic metabolic panel     Status: Abnormal   Collection Time: 04/08/23  6:02 PM  Result Value Ref Range   Sodium 137 135 - 145 mmol/L   Potassium 3.8 3.5 - 5.1 mmol/L   Chloride 100 98 - 111 mmol/L   CO2 23 22 - 32 mmol/L   Glucose, Bld 129 (H) 70 - 99 mg/dL    Comment: Glucose reference range applies only to samples taken after fasting for at least 8 hours.   BUN 20 6 - 20 mg/dL   Creatinine, Ser 0.98 0.61 - 1.24 mg/dL   Calcium 9.3 8.9 - 11.9 mg/dL   GFR, Estimated >14 >78 mL/min    Comment: (NOTE) Calculated using the CKD-EPI Creatinine Equation (2021)    Anion gap 14 5 - 15    Comment: Performed at [facility], [address], [place], [place] [address]  CBC     Status: None   Collection Time: 04/08/23  6:02 PM  Result Value Ref Range   WBC 10.2 4.0 - 10.5 K/uL   RBC 5.50 4.22 - 5.81 MIL/uL   Hemoglobin 17.0 13.0 - 17.0 g/dL   HCT 13.0 86.5 - 78.4 %  MCV 90.9 80.0 - 100.0 fL   MCH 30.9 26.0 - 34.0 pg   MCHC 34.0 30.0 - 36.0 g/dL   RDW 16.1 09.6 - 04.5 %   Platelets 260 150 - 400 K/uL   nRBC 0.0 0.0 - 0.2 %    Comment: Performed at [facility], [address] [place], [place] [address]  Troponin I (High Sensitivity)     Status: None   Collection Time: 04/08/23  6:02 PM  Result Value Ref Range   Troponin I (High Sensitivity) 14 <18 ng/L    Comment: (NOTE) Elevated high sensitivity troponin I (hsTnI) values and significant  changes across serial measurements may suggest ACS but many other  chronic and acute conditions are known to elevate hsTnI results.  Refer to the "Links" section for chest pain algorithms  and additional  guidance. Performed at [facility], [address] [place], [place] [address]   Brain natriuretic peptide     Status: Abnormal   Collection Time: 04/08/23  6:02 PM  Result Value Ref Range   B Natriuretic Peptide 141.9 (H) 0.0 - 100.0 pg/mL    Comment: Performed at [facility], [address] [place], [place] [address]   DG Chest Port 1 View  Result Date: 04/08/2023 CLINICAL DATA:  Chest pain EXAM: PORTABLE CHEST 1 VIEW COMPARISON:  05/01/2014 x-ray series FINDINGS: No consolidation, pneumothorax or effusion. No edema. Normal cardiopericardial silhouette. Overlapping cardiac leads. Films are under penetrated. IMPRESSION: No acute cardiopulmonary disease Electronically Signed   By: Karen Kays M.D.   On: 04/08/2023 18:27    Pending Labs Unresulted Labs (From admission, onward)     Start     Ordered   04/10/23 0500  Heparin level (unfractionated)  Daily,   R     See Hyperspace for full Linked Orders Report.   04/08/23 1826   04/09/23 0500  CBC  Daily,   R     See Hyperspace for full Linked Orders Report.   04/08/23 1826   04/09/23 0500  TSH  Tomorrow morning,   R        04/08/23 2016   04/09/23 0500  Basic metabolic panel  Tomorrow morning,   R        04/08/23 2016   04/09/23 0500  Magnesium  Tomorrow morning,   R        04/08/23 2016   04/09/23 0100  Heparin level (unfractionated)  Once-Timed,   URGENT        04/08/23 1826   04/08/23 2013  HIV Antibody (routine testing w rflx)  (HIV Antibody (Routine testing w reflex) panel)  Once,   R        04/08/23 2016   04/08/23 1804  SARS Coronavirus 2 by RT PCR (hospital order, performed in [facility] hospital lab) *cepheid single result test* Anterior Nasal Swab  (Tier 2 - SARS Coronavirus 2 by RT PCR (hospital order, performed in [facility] hospital lab) *cepheid single result test*)  Once,   URGENT        04/08/23 1804            Vitals/Pain Today's Vitals   04/08/23 1930 04/08/23 1945 04/08/23  2000 04/08/23 2015  BP: (!) 120/92 119/86 110/72 114/87  Pulse: 80 83 75 83  Resp: 19 15 18 14   Temp:      SpO2: 97% 99% 97% 98%  Weight:      Height:  PainSc:        Isolation Precautions Airborne and Contact precautions  Medications Medications  diltiazem (CARDIZEM) 1 mg/mL load via infusion 20 mg (20 mg Intravenous Bolus from Bag 04/08/23 1815)    And  diltiazem (CARDIZEM) 125 mg in dextrose 5% 125 mL (1 mg/mL) infusion (5 mg/hr Intravenous New Bag/Given 04/08/23 1812)  heparin ADULT infusion 100 units/mL (25000 units/28mL) (1,600 Units/hr Intravenous New Bag/Given 04/08/23 1842)  aspirin chewable tablet 324 mg (324 mg Oral Given 04/08/23 1809)  heparin bolus via infusion 6,000 Units (6,000 Units Intravenous Bolus from Bag 04/08/23 1842)    Mobility walks     Focused Assessments Cardiac Assessment Handoff:    Lab Results  Component Value Date   TROPONINI <0.30 05/01/2014   No results found for: "DDIMER" Does the Patient currently have chest pain? No    R Recommendations: See Admitting Provider Note  Report given to:   Additional Notes:

## 2023-04-09 ENCOUNTER — Telehealth (HOSPITAL_COMMUNITY): Payer: Self-pay | Admitting: Pharmacy Technician

## 2023-04-09 ENCOUNTER — Inpatient Hospital Stay (HOSPITAL_BASED_OUTPATIENT_CLINIC_OR_DEPARTMENT_OTHER): Payer: 59

## 2023-04-09 ENCOUNTER — Other Ambulatory Visit (HOSPITAL_COMMUNITY): Payer: Self-pay

## 2023-04-09 DIAGNOSIS — I4891 Unspecified atrial fibrillation: Secondary | ICD-10-CM | POA: Diagnosis not present

## 2023-04-09 DIAGNOSIS — G4733 Obstructive sleep apnea (adult) (pediatric): Secondary | ICD-10-CM | POA: Diagnosis not present

## 2023-04-09 DIAGNOSIS — I10 Essential (primary) hypertension: Secondary | ICD-10-CM | POA: Diagnosis not present

## 2023-04-09 LAB — ECHOCARDIOGRAM COMPLETE
Area-P 1/2: 4 cm2
Height: 75 in
MV M vel: 2.42 m/s
MV Peak grad: 23.4 mmHg
MV VTI: 4.33 cm2
S' Lateral: 3.7 cm
Weight: 4800 oz

## 2023-04-09 LAB — BASIC METABOLIC PANEL
Anion gap: 10 (ref 5–15)
BUN: 20 mg/dL (ref 6–20)
CO2: 24 mmol/L (ref 22–32)
Calcium: 8.6 mg/dL — ABNORMAL LOW (ref 8.9–10.3)
Chloride: 104 mmol/L (ref 98–111)
Creatinine, Ser: 0.86 mg/dL (ref 0.61–1.24)
GFR, Estimated: >60 mL/min (ref >60)
Glucose, Bld: 172 mg/dL — ABNORMAL HIGH (ref 70–99)
Potassium: 3.3 mmol/L — ABNORMAL LOW (ref 3.5–5.1)
Sodium: 138 mmol/L (ref 135–145)

## 2023-04-09 LAB — CBC
HCT: 46.2 % (ref 39.0–52.0)
Hemoglobin: 15.5 g/dL (ref 13.0–17.0)
MCH: 30.3 pg (ref 26.0–34.0)
MCHC: 33.5 g/dL (ref 30.0–36.0)
MCV: 90.2 fL (ref 80.0–100.0)
Platelets: 212 10*3/uL (ref 150–400)
RBC: 5.12 MIL/uL (ref 4.22–5.81)
RDW: 12.7 % (ref 11.5–15.5)
WBC: 8.7 10*3/uL (ref 4.0–10.5)
nRBC: 0 % (ref 0.0–0.2)

## 2023-04-09 LAB — TROPONIN I (HIGH SENSITIVITY): Troponin I (High Sensitivity): 9 ng/L (ref <18)

## 2023-04-09 LAB — HIV ANTIBODY (ROUTINE TESTING W REFLEX): HIV Screen 4th Generation wRfx: NONREACTIVE

## 2023-04-09 LAB — TSH: TSH: 2.272 u[IU]/mL (ref 0.350–4.500)

## 2023-04-09 LAB — HEPARIN LEVEL (UNFRACTIONATED)
Heparin Unfractionated: 0.19 IU/mL — ABNORMAL LOW (ref 0.30–0.70)
Heparin Unfractionated: 0.44 IU/mL (ref 0.30–0.70)

## 2023-04-09 LAB — MAGNESIUM: Magnesium: 2.1 mg/dL (ref 1.7–2.4)

## 2023-04-09 MED ORDER — PERFLUTREN LIPID MICROSPHERE
1.0000 mL | INTRAVENOUS | Status: DC | PRN
  Administered 2023-04-09: 2 mL via INTRAVENOUS

## 2023-04-09 MED ORDER — APIXABAN 5 MG PO TABS
5.0000 mg | ORAL_TABLET | Freq: Two times a day (BID) | ORAL | 0 refills | Status: DC
  Filled 2023-04-09: qty 60, 30d supply, fill #0

## 2023-04-09 MED ORDER — CARVEDILOL 3.125 MG PO TABS
3.1250 mg | ORAL_TABLET | Freq: Two times a day (BID) | ORAL | 0 refills | Status: DC
  Filled 2023-04-09: qty 60, 30d supply, fill #0

## 2023-04-09 MED ORDER — CARVEDILOL 3.125 MG PO TABS: 3.1250 mg | ORAL_TABLET | Freq: Two times a day (BID) | ORAL | Status: DC

## 2023-04-09 MED ORDER — HEPARIN BOLUS VIA INFUSION
3000.0000 [IU] | Freq: Once | INTRAVENOUS | Status: AC
  Administered 2023-04-09: 3000 [IU] via INTRAVENOUS
  Filled 2023-04-09: qty 3000

## 2023-04-09 MED ORDER — APIXABAN 5 MG PO TABS
5.0000 mg | ORAL_TABLET | Freq: Two times a day (BID) | ORAL | Status: DC
  Administered 2023-04-09: 5 mg via ORAL
  Filled 2023-04-09: qty 1

## 2023-04-09 MED ORDER — CARVEDILOL 6.25 MG PO TABS
6.2500 mg | ORAL_TABLET | Freq: Two times a day (BID) | ORAL | Status: DC
  Administered 2023-04-09: 6.25 mg via ORAL
  Filled 2023-04-09: qty 1

## 2023-04-09 NOTE — Progress Notes (Signed)
ANTICOAGULATION CONSULT NOTE  Pharmacy Consult for heparin Indication: atrial fibrillation  No Known Allergies  Patient Measurements: Height: 6\' 3"  (190.5 cm) Weight: 136.1 kg (300 lb) IBW/kg (Calculated) : 84.5 Heparin Dosing Weight: 115kg  Vital Signs: Temp: 97.8 F (36.6 C) (05/22 0907) Temp Source: Oral (05/22 0907) BP: 117/80 (05/22 0907) Pulse Rate: 56 (05/22 0907)  Labs: Recent Labs    04/08/23 1802 04/08/23 2005 04/09/23 0114 04/09/23 0530 04/09/23 0948  HGB 17.0  --  15.5  --   --   HCT 50.0  --  46.2  --   --   PLT 260  --  212  --   --   HEPARINUNFRC  --   --  0.19*  --  0.44  CREATININE 1.00  --  0.86  --   --   TROPONINIHS 14 16  --  9  --      Estimated Creatinine Clearance: 140.9 mL/min (by C-G formula based on SCr of 0.86 mg/dL).   Medical History: Past Medical History:  Diagnosis Date   Hypertension      Assessment: 38 YOM presenting with CP and SOB, in Afib, he is not on anticoagulation PTA. Pharmacy dosing heparin   -heparin level at goal on 1800 units/hr, CBC stable  Goal of Therapy:  Heparin level 0.3-0.7 units/ml Monitor platelets by anticoagulation protocol: Yes   Plan:  -Continue heparin at 1800 units/hr -Confirm heparin level later today  Harland German, PharmD Clinical Pharmacist **Pharmacist phone directory can now be found on [email or web address] ([email or web address]).  Listed under [facility].

## 2023-04-09 NOTE — Telephone Encounter (Signed)
Pharmacy Patient Advocate Encounter  Insurance verification completed.    The patient is insured through [employer]   The patient is currently admitted and ran test claims for the following: Eliquis, Xarelto.  Copays and coinsurance results were relayed to Inpatient clinical team.      

## 2023-04-09 NOTE — Discharge Summary (Addendum)
Physician Discharge Summary  Tim [name] VWU:[id number] DOB: 1965/12/29 DOA: 04/08/2023  PCP: Pcp, No  Admit date: 04/08/2023 Discharge date: 04/09/2023 30 Day Unplanned Readmission Risk Score    Flowsheet Row ED to Hosp-Admission (Current) from 04/08/2023 in [place] [facility]  30 Day Unplanned Readmission Risk Score (%) 8.61 Filed at 04/09/2023 1201       This score is the patient's risk of an unplanned readmission within 30 days of being discharged (0 -100%). The score is based on dignosis, age, lab data, medications, orders, and past utilization.   Low:  0-14.9   Medium: 15-21.9   High: 22-29.9   Extreme: 30 and above          Admitted From: Home Disposition: Home  Recommendations for Outpatient Follow-up:  Follow up with PCP in 1-2 weeks Please obtain BMP/CBC in one week Follow-up with cardiology as outpatient-they will arrange. Please follow up with your PCP on the following pending results: Unresulted Labs (From admission, onward)     Start     Ordered   04/09/23 0500  CBC  Daily,   R     See Hyperspace for full Linked Orders Report.   04/08/23 1826              Home Health: None Equipment/Devices: None  Discharge Condition: Stable  CODE STATUS: Full code Diet recommendation: Cardiac  Subjective: Patient seen and examined.  No complaints.  Denies palpitation, chest pain or shortness of breath.  Eager to go home.  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  The patient does use his CPAP nightly as required by his job.  He does not sleep well at night because of back pain and leg cramps.  He came to the ED with the major complaint of heart racing and mild shortness of breath. When he arrived in the emergency department the patient's heart rate was greater than 150.  He was found to be in atrial fibrillation and was given a Cardizem bolus then  started on a Cardizem drip.  Admitted under hospital service, cardiology consulted.  He was started on heparin drip.  Overnight, he converted to sinus rhythm, diltiazem drip was stopped, he was started on Coreg.  He was seen by cardiology, they reduced his dose of Coreg due to an episode of bradycardia and started him on Eliquis and cleared him for discharge once echo is completed.  Echo was completed, results pending at the time of dictation.  They will follow-up with him as outpatient.  Regarding his hypertension, patient blood pressure has remained very well-controlled, he was taking amlodipine prior to admission.  Since he is going to be on Coreg, I have discontinued amlodipine.  Resume hydrochlorothiazide and lisinopril.  Addendum: Echo is resulted.  It shows 45 to 50% ejection fraction with global hypokinesis.  Normal diastolic function.  He is to follow with cardiology as outpatient.  Discharge Diagnoses:  Principal Problem:   Atrial fibrillation with rapid ventricular response (HCC) Active Problems:   OSA (obstructive sleep apnea)    Discharge Instructions   Allergies as of 04/09/2023   No Known Allergies      Medication List     STOP taking these medications    amLODipine 5 MG tablet Commonly known as: NORVASC       TAKE these medications    apixaban 5 MG Tabs tablet Commonly known as: ELIQUIS Take  1 tablet (5 mg total) by mouth 2 (two) times daily.   b complex vitamins tablet Take 1 tablet by mouth daily.   carvedilol 3.125 MG tablet Commonly known as: COREG Take 1 tablet (3.125 mg total) by mouth 2 (two) times daily with a meal.   cholecalciferol 1000 units tablet Commonly known as: VITAMIN D Take 1,000 Units by mouth daily.   dicyclomine 20 MG tablet Commonly known as: BENTYL Take 20 mg by mouth 2 (two) times daily.   lisinopril-hydrochlorothiazide 20-25 MG tablet Commonly known as: ZESTORETIC Take 1 tablet by mouth daily.   montelukast 10 MG  tablet Commonly known as: SINGULAIR Take 10 mg by mouth daily.   tamsulosin 0.4 MG Caps capsule Commonly known as: FLOMAX Take 0.4 mg by mouth at bedtime.   VITAMIN C PO Take 1 tablet by mouth daily.        Follow-up Information     PCP Follow up in 1 week(s).          [facility] Follow up.   Why: Please call this number for medical records. Contact information: Phone: [phone] Fax: [phone]               No Known Allergies  Consultations: Cardiology   Procedures/Studies: DG Chest Port 1 View  Result Date: 04/08/2023 CLINICAL DATA:  Chest pain EXAM: PORTABLE CHEST 1 VIEW COMPARISON:  05/01/2014 x-ray series FINDINGS: No consolidation, pneumothorax or effusion. No edema. Normal cardiopericardial silhouette. Overlapping cardiac leads. Films are under penetrated. IMPRESSION: No acute cardiopulmonary disease Electronically Signed   By: Karen Kays M.D.   On: 04/08/2023 18:27     Discharge Exam: Vitals:   04/09/23 0907 04/09/23 1210  BP: 117/80 124/89  Pulse: (!) 56 63  Resp: 18 17  Temp: 97.8 F (36.6 C)   SpO2: 98% 99%   Vitals:   04/09/23 0808 04/09/23 0810 04/09/23 0907 04/09/23 1210  BP: 111/73  117/80 124/89  Pulse: 61  (!) 56 63  Resp: 14  18 17   Temp:  98 F (36.7 C) 97.8 F (36.6 C)   TempSrc:  Oral Oral   SpO2: 99%  98% 99%  Weight:      Height:        General: Pt is alert, awake, not in acute distress Cardiovascular: RRR, S1/S2 +, no rubs, no gallops Respiratory: CTA bilaterally, no wheezing, no rhonchi Abdominal: Soft, NT, ND, bowel sounds + Extremities: no edema, no cyanosis    The results of significant diagnostics from this hospitalization (including imaging, microbiology, ancillary and laboratory) are listed below for reference.     Microbiology: Recent Results (from the past 240 hour(s))  SARS Coronavirus 2 by RT PCR (hospital order, performed in [facility] hospital lab) *cepheid single  result test* Anterior Nasal Swab     Status: None   Collection Time: 04/08/23  6:04 PM   Specimen: Anterior Nasal Swab  Result Value Ref Range Status   SARS Coronavirus 2 by RT PCR NEGATIVE NEGATIVE Final    Comment: Performed at [facility], [address], [place], [place] [address]     Labs: BNP (last 3 results) Recent Labs    04/08/23 1802  BNP 141.9*   Basic Metabolic Panel: Recent Labs  Lab 04/08/23 1802 04/09/23 0114  NA 137 138  K 3.8 3.3*  CL 100 104  CO2 23 24  GLUCOSE 129* 172*  BUN 20 20  CREATININE 1.00 0.86  CALCIUM 9.3  8.6*  MG  --  2.1   Liver Function Tests: No results for input(s): "AST", "ALT", "ALKPHOS", "BILITOT", "PROT", "ALBUMIN" in the last 168 hours. No results for input(s): "LIPASE", "AMYLASE" in the last 168 hours. No results for input(s): "AMMONIA" in the last 168 hours. CBC: Recent Labs  Lab 04/08/23 1802 04/09/23 0114  WBC 10.2 8.7  HGB 17.0 15.5  HCT 50.0 46.2  MCV 90.9 90.2  PLT 260 212   Cardiac Enzymes: No results for input(s): "CKTOTAL", "CKMB", "CKMBINDEX", "TROPONINI" in the last 168 hours. BNP: Invalid input(s): "POCBNP" CBG: No results for input(s): "GLUCAP" in the last 168 hours. D-Dimer No results for input(s): "DDIMER" in the last 72 hours. Hgb A1c No results for input(s): "HGBA1C" in the last 72 hours. Lipid Profile No results for input(s): "CHOL", "HDL", "LDLCALC", "TRIG", "CHOLHDL", "LDLDIRECT" in the last 72 hours. Thyroid function studies Recent Labs    04/09/23 0114  TSH 2.272   Anemia work up No results for input(s): "VITAMINB12", "FOLATE", "FERRITIN", "TIBC", "IRON", "RETICCTPCT" in the last 72 hours. Urinalysis No results found for: "COLORURINE", "APPEARANCEUR", "LABSPEC", "PHURINE", "GLUCOSEU", "HGBUR", "BILIRUBINUR", "KETONESUR", "PROTEINUR", "UROBILINOGEN", "NITRITE", "LEUKOCYTESUR" Sepsis Labs Recent Labs  Lab 04/08/23 1802 04/09/23 0114  WBC 10.2 8.7   Microbiology Recent Results  (from the past 240 hour(s))  SARS Coronavirus 2 by RT PCR (hospital order, performed in [facility] hospital lab) *cepheid single result test* Anterior Nasal Swab     Status: None   Collection Time: 04/08/23  6:04 PM   Specimen: Anterior Nasal Swab  Result Value Ref Range Status   SARS Coronavirus 2 by RT PCR NEGATIVE NEGATIVE Final    Comment: Performed at [facility], [address] [place], [place] [address]     Time coordinating discharge: Over 30 minutes  SIGNED:   Hughie Closs, MD  [facility] 04/09/2023, 2:35 PM *Please note that this is a verbal dictation therefore any spelling or grammatical errors are due to the "Dragon Medical One" system interpretation. If 7PM-7AM, please contact night-coverage [email or web address]

## 2023-04-09 NOTE — Discharge Instructions (Addendum)

## 2023-04-09 NOTE — Plan of Care (Signed)
Discharge instructions discussed with patient.  Patient instructed on home medications, restrictions, and follow up appointments. Belongings gathered and sent with patient.  Patients medications received from [facility] pharmacy.  Patient discharged via wheelchair by this Clinical research associate.

## 2023-04-09 NOTE — ED Notes (Signed)
ED TO INPATIENT HANDOFF REPORT  ED Nurse Name and Phone #: Vernona Rieger [phone]  R Name/Age/Gender Tim [name] 56 y.o. male Room/Bed: 004C/004C  Code Status   Code Status: Full Code  Home/SNF/Other Home Patient oriented to: self, place, time, and situation Is this baseline? Yes   Triage Complete: Triage complete  Chief Complaint Atrial fibrillation with RVR (HCC) [I48.91]  Triage Note Pt c/o midsternal chest pain and SOB started at 0900. Pt states his apple watch told him he was in A-fib and tachy at 156 and pt doesn't have a hx of it.    Allergies No Known Allergies  Level of Care/Admitting Diagnosis ED Disposition     ED Disposition  Admit   Condition  --   Comment  Hospital Area: [facility] [[id number]]  Level of Care: Progressive [102]  Admit to Progressive based on following criteria: CARDIOVASCULAR & THORACIC of moderate stability with acute coronary syndrome symptoms/low risk myocardial infarction/hypertensive urgency/arrhythmias/heart failure potentially compromising stability and stable post cardiovascular intervention patients.  May admit patient to [facility] or [facility] if equivalent level of care is available:: No  Covid Evaluation: Asymptomatic - no recent exposure (last 10 days) testing not required  Diagnosis: Atrial fibrillation with RVR [facility]) [[id number]]  Admitting Physician: Buena Irish [[id number]]  Attending Physician: Buena Irish [phone]  Certification:: I certify this patient will need inpatient services for at least 2 midnights  Estimated Length of Stay: 2          B Medical/Surgery History Past Medical History:  Diagnosis Date   Hypertension    Past Surgical History:  Procedure Laterality Date   CHOLECYSTECTOMY       A IV Location/Drains/Wounds Patient Lines/Drains/Airways Status     Active Line/Drains/Airways     Name Placement date Placement time Site Days   Peripheral IV 04/08/23 18 G Left Antecubital  04/08/23  1805  Antecubital  1   Peripheral IV 04/08/23 20 G Right Antecubital 04/08/23  1910  Antecubital  1            Intake/Output Last 24 hours  Intake/Output Summary (Last 24 hours) at 04/09/2023 0729 Last data filed at 04/09/2023 0008 Gross per 24 hour  Intake 29.67 ml  Output --  Net 29.67 ml    Labs/Imaging Results for orders placed or performed during the hospital encounter of 04/08/23 (from the past 48 hour(s))  Basic metabolic panel     Status: Abnormal   Collection Time: 04/08/23  6:02 PM  Result Value Ref Range   Sodium 137 135 - 145 mmol/L   Potassium 3.8 3.5 - 5.1 mmol/L   Chloride 100 98 - 111 mmol/L   CO2 23 22 - 32 mmol/L   Glucose, Bld 129 (H) 70 - 99 mg/dL    Comment: Glucose reference range applies only to samples taken after fasting for at least 8 hours.   BUN 20 6 - 20 mg/dL   Creatinine, Ser 9.14 0.61 - 1.24 mg/dL   Calcium 9.3 8.9 - 78.2 mg/dL   GFR, Estimated >95 >62 mL/min    Comment: (NOTE) Calculated using the CKD-EPI Creatinine Equation (2021)    Anion gap 14 5 - 15    Comment: Performed at [facility], [address], [place], [place] [address]  CBC     Status: None   Collection Time: 04/08/23  6:02 PM  Result Value Ref Range   WBC 10.2 4.0 - 10.5 K/uL   RBC 5.50 4.22 -  5.81 MIL/uL   Hemoglobin 17.0 13.0 - 17.0 g/dL   HCT 16.1 09.6 - 04.5 %   MCV 90.9 80.0 - 100.0 fL   MCH 30.9 26.0 - 34.0 pg   MCHC 34.0 30.0 - 36.0 g/dL   RDW 40.9 81.1 - 91.4 %   Platelets 260 150 - 400 K/uL   nRBC 0.0 0.0 - 0.2 %    Comment: Performed at [facility], [address] [place], [place] [address]  Troponin I (High Sensitivity)     Status: None   Collection Time: 04/08/23  6:02 PM  Result Value Ref Range   Troponin I (High Sensitivity) 14 <18 ng/L    Comment: (NOTE) Elevated high sensitivity troponin I (hsTnI) values and significant  changes across serial measurements may suggest ACS but many other  chronic and acute conditions are  known to elevate hsTnI results.  Refer to the "Links" section for chest pain algorithms and additional  guidance. Performed at [facility], [address], [place], [place] [address]   Brain natriuretic peptide     Status: Abnormal   Collection Time: 04/08/23  6:02 PM  Result Value Ref Range   B Natriuretic Peptide 141.9 (H) 0.0 - 100.0 pg/mL    Comment: Performed at [facility], [address] [place], [place] [address]  SARS Coronavirus 2 by RT PCR (hospital order, performed in [facility] hospital lab) *cepheid single result test* Anterior Nasal Swab     Status: None   Collection Time: 04/08/23  6:04 PM   Specimen: Anterior Nasal Swab  Result Value Ref Range   SARS Coronavirus 2 by RT PCR NEGATIVE NEGATIVE    Comment: Performed at [facility], [address], [place], [place] [address]  Troponin I (High Sensitivity)     Status: None   Collection Time: 04/08/23  8:05 PM  Result Value Ref Range   Troponin I (High Sensitivity) 16 <18 ng/L    Comment: (NOTE) Elevated high sensitivity troponin I (hsTnI) values and significant  changes across serial measurements may suggest ACS but many other  chronic and acute conditions are known to elevate hsTnI results.  Refer to the "Links" section for chest pain algorithms and additional  guidance. Performed at [facility], [address], [place], [place] [address]   Heparin level (unfractionated)     Status: Abnormal   Collection Time: 04/09/23  1:14 AM  Result Value Ref Range   Heparin Unfractionated 0.19 (L) 0.30 - 0.70 IU/mL    Comment: (NOTE) The clinical reportable range upper limit is being lowered to >1.10 to align with the FDA approved guidance for the current laboratory assay.  If heparin results are below expected values, and patient dosage has  been confirmed, suggest follow up testing of antithrombin III levels. Performed at [facility], [address] [place], [place] [address]   CBC      Status: None   Collection Time: 04/09/23  1:14 AM  Result Value Ref Range   WBC 8.7 4.0 - 10.5 K/uL   RBC 5.12 4.22 - 5.81 MIL/uL   Hemoglobin 15.5 13.0 - 17.0 g/dL   HCT 01.0 27.2 - 53.6 %   MCV 90.2 80.0 - 100.0 fL   MCH 30.3 26.0 - 34.0 pg   MCHC 33.5 30.0 - 36.0 g/dL   RDW 64.4 03.4 - 74.2 %   Platelets 212 150 - 400 K/uL   nRBC 0.0 0.0 -  0.2 %    Comment: Performed at [facility], [address], [place], [place] [address]  Basic metabolic panel     Status: Abnormal   Collection Time: 04/09/23  1:14 AM  Result Value Ref Range   Sodium 138 135 - 145 mmol/L   Potassium 3.3 (L) 3.5 - 5.1 mmol/L   Chloride 104 98 - 111 mmol/L   CO2 24 22 - 32 mmol/L   Glucose, Bld 172 (H) 70 - 99 mg/dL    Comment: Glucose reference range applies only to samples taken after fasting for at least 8 hours.   BUN 20 6 - 20 mg/dL   Creatinine, Ser 4.78 0.61 - 1.24 mg/dL   Calcium 8.6 (L) 8.9 - 10.3 mg/dL   GFR, Estimated >29 >56 mL/min    Comment: (NOTE) Calculated using the CKD-EPI Creatinine Equation (2021)    Anion gap 10 5 - 15    Comment: Performed at [facility], [address], [place], [place] [address]  HIV Antibody (routine testing w rflx)     Status: None   Collection Time: 04/09/23  1:14 AM  Result Value Ref Range   HIV Screen 4th Generation wRfx Non Reactive Non Reactive    Comment: Performed at [facility], [address] [place], [place] [address]  Magnesium     Status: None   Collection Time: 04/09/23  1:14 AM  Result Value Ref Range   Magnesium 2.1 1.7 - 2.4 mg/dL    Comment: Performed at [facility], [address] [place], [place] [address]  TSH     Status: None   Collection Time: 04/09/23  1:14 AM  Result Value Ref Range   TSH 2.272 0.350 - 4.500 uIU/mL    Comment: Performed by a 3rd Generation assay with a functional sensitivity of <=0.01 uIU/mL. Performed at [facility], [address], [place], [place] [address]   Troponin I  (High Sensitivity)     Status: None   Collection Time: 04/09/23  5:30 AM  Result Value Ref Range   Troponin I (High Sensitivity) 9 <18 ng/L    Comment: (NOTE) Elevated high sensitivity troponin I (hsTnI) values and significant  changes across serial measurements may suggest ACS but many other  chronic and acute conditions are known to elevate hsTnI results.  Refer to the "Links" section for chest pain algorithms and additional  guidance. Performed at [facility], [address] [place], [place] [address]    DG Chest Port 1 View  Result Date: 04/08/2023 CLINICAL DATA:  Chest pain EXAM: PORTABLE CHEST 1 VIEW COMPARISON:  05/01/2014 x-ray series FINDINGS: No consolidation, pneumothorax or effusion. No edema. Normal cardiopericardial silhouette. Overlapping cardiac leads. Films are under penetrated. IMPRESSION: No acute cardiopulmonary disease Electronically Signed   By: Karen Kays M.D.   On: 04/08/2023 18:27    Pending Labs Unresulted Labs (From admission, onward)     Start     Ordered   04/10/23 0500  Heparin level (unfractionated)  Daily,   R     See Hyperspace for full Linked Orders Report.   04/08/23 1826   04/09/23 1000  Heparin level (unfractionated)  Once-Timed,   TIMED        04/09/23 0216   04/09/23 0500  CBC  Daily,   R     See Hyperspace for full Linked Orders Report.   04/08/23 1826  Vitals/Pain Today's Vitals   04/09/23 0211 04/09/23 0300 04/09/23 0430 04/09/23 0625  BP:  117/73 103/82 107/73  Pulse:  63 71 63  Resp:  14 13 13   Temp: 97.7 F (36.5 C)   97.7 F (36.5 C)  TempSrc: Oral   Oral  SpO2:  96% 97% 99%  Weight:      Height:      PainSc:  Asleep      Isolation Precautions Airborne and Contact precautions  Medications Medications  diltiazem (CARDIZEM) 1 mg/mL load via infusion 20 mg (20 mg Intravenous Bolus from Bag 04/08/23 1815)    And  diltiazem (CARDIZEM) 125 mg in dextrose 5% 125 mL (1 mg/mL) infusion (0 mg/hr  Intravenous Stopped 04/09/23 0008)  heparin ADULT infusion 100 units/mL (25000 units/222mL) (1,800 Units/hr Intravenous New Bag/Given 04/09/23 0510)  carvedilol (COREG) tablet 6.25 mg (has no administration in time range)  aspirin chewable tablet 324 mg (324 mg Oral Given 04/08/23 1809)  heparin bolus via infusion 6,000 Units (6,000 Units Intravenous Bolus from Bag 04/08/23 1842)  heparin bolus via infusion 3,000 Units (3,000 Units Intravenous Bolus from Bag 04/09/23 0225)    Mobility walks     Focused Assessments Cardiac Assessment Handoff:  Cardiac Rhythm: Atrial fibrillation Lab Results  Component Value Date   TROPONINI <0.30 05/01/2014   No results found for: "DDIMER" Does the Patient currently have chest pain? No    R Recommendations: See Admitting Provider Note  Report given to:   Additional Notes:

## 2023-04-09 NOTE — H&P (Signed)
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  The patient does use his CPAP nightly as required by his job.  He does not sleep well at night because of back pain and leg cramps.  This morning at approximately 9:00 the patient felt his heart racing.  He describes some lightheadedness and very mild heaviness of his chest.  He was not significantly short of breath he did not feel any skipping or irregularity in his heartbeat per se.  It was not severe so he went out to work and was in another state by the time decided to check his Apple Watch.  His Apple Watch revealed that he was in atrial fibrillation.  Patient continued his driving route and when he got home at approximately 5:00 PM he came to the emergency department.  His symptoms continued with him all during the day but he was able to keep driving. When he arrived in the emergency department the patient's heart rate was greater than 150.  He was found to be in atrial fibrillation and was given a Cardizem bolus then started on a Cardizem drip which controlled his rate nicely. Cardiology was consulted by the ED provider.  IV heparin was recommended in case the patient needs to be cardioverted. The patient tells me that he had a negative treadmill stress test and what sounds like a Holter monitor by [facility] cardiology in October 2023.  He saw cardiology after his Apple Watch revealed that his heart rate was dropping in the 40s at night. Their evaluation found that everything was fine.  The patient has not had any trouble since that time.    Review of Systems: As mentioned in the history of  present illness. All other systems reviewed and are negative. Past Medical History:  Diagnosis Date   Hypertension    Past Surgical History:  Procedure Laterality Date   CHOLECYSTECTOMY     Social History:  reports that he has been smoking cigars. He does not have any smokeless tobacco history on file. He reports current alcohol use. He reports that he does not use drugs.  No Known Allergies  History reviewed. No pertinent family history.  Prior to Admission medications   Medication Sig Start Date End Date Taking? Authorizing Provider  amLODipine (NORVASC) 5 MG tablet Take 5 mg by mouth daily.  04/17/14   [provider]  azelastine (ASTELIN) 0.1 % nasal spray Place 2 sprays into both nostrils daily.  02/11/14   [provider]  b complex vitamins tablet Take 1 tablet by mouth daily.    [provider]  cholecalciferol (VITAMIN D) 1000 UNITS tablet Take 1,000 Units by mouth daily.    [provider]  GLUCOSAMINE-CHONDROITIN PO Take 2 tablets by mouth daily.    [provider]  ibuprofen (ADVIL,MOTRIN) 200 MG tablet Take 400 mg by mouth daily as needed (pain).    [provider]  lisinopril (PRINIVIL,ZESTRIL) 2.5 MG tablet Take 2.5 mg by mouth daily.    [provider]  Omega-3 Fatty Acids (FISH OIL PO) Take 1 capsule by mouth daily.    [provider]  Physical Exam: Vitals:   04/08/23 2203 04/08/23 2205 04/08/23 2228 04/08/23 2245  BP:    108/77  Pulse:  70 64 68  Resp:   17 15  Temp: 97.7 F (36.5 C)     TempSrc: Oral     SpO2:   99% 99%  Weight:      Height:       Physical Exam:  General: No acute distress, well developed, well nourished HEENT: Normocephalic, atraumatic, PERRL Cardiovascular: occasionally irregular, not tachy.  HR 70. Distal pulses intact. Pulmonary: Normal pulmonary effort, normal breath sounds Gastrointestinal: Nondistended abdomen, soft, non-tender, normoactive bowel sounds, no  organomegaly, obese Musculoskeletal:Normal ROM, no lower ext edema Lymphadenopathy: No cervical LAD. Skin: Skin is warm and dry. Neuro: No focal deficits noted, AAOx3. PSYCH: Attentive and cooperative  Data Reviewed:  Results for orders placed or performed during the hospital encounter of 04/08/23 (from the past 24 hour(s))  Basic metabolic panel     Status: Abnormal   Collection Time: 04/08/23  6:02 PM  Result Value Ref Range   Sodium 137 135 - 145 mmol/L   Potassium 3.8 3.5 - 5.1 mmol/L   Chloride 100 98 - 111 mmol/L   CO2 23 22 - 32 mmol/L   Glucose, Bld 129 (H) 70 - 99 mg/dL   BUN 20 6 - 20 mg/dL   Creatinine, Ser 1.61 0.61 - 1.24 mg/dL   Calcium 9.3 8.9 - 09.6 mg/dL   GFR, Estimated >04 >54 mL/min   Anion gap 14 5 - 15  CBC     Status: None   Collection Time: 04/08/23  6:02 PM  Result Value Ref Range   WBC 10.2 4.0 - 10.5 K/uL   RBC 5.50 4.22 - 5.81 MIL/uL   Hemoglobin 17.0 13.0 - 17.0 g/dL   HCT 09.8 11.9 - 14.7 %   MCV 90.9 80.0 - 100.0 fL   MCH 30.9 26.0 - 34.0 pg   MCHC 34.0 30.0 - 36.0 g/dL   RDW 82.9 56.2 - 13.0 %   Platelets 260 150 - 400 K/uL   nRBC 0.0 0.0 - 0.2 %  Troponin I (High Sensitivity)     Status: None   Collection Time: 04/08/23  6:02 PM  Result Value Ref Range   Troponin I (High Sensitivity) 14 <18 ng/L  Brain natriuretic peptide     Status: Abnormal   Collection Time: 04/08/23  6:02 PM  Result Value Ref Range   B Natriuretic Peptide 141.9 (H) 0.0 - 100.0 pg/mL  SARS Coronavirus 2 by RT PCR (hospital order, performed in [facility] lab) *cepheid single result test* Anterior Nasal Swab     Status: None   Collection Time: 04/08/23  6:04 PM   Specimen: Anterior Nasal Swab  Result Value Ref Range   SARS Coronavirus 2 by RT PCR NEGATIVE NEGATIVE  Troponin I (High Sensitivity)     Status: None   Collection Time: 04/08/23  8:05 PM  Result Value Ref Range   Troponin I (High Sensitivity) 16 <18 ng/L    Assessment and Plan: 1- New onset  atrial fibrillation with RVR - The patient's rate is currently controlled on a Cardizem drip - Cardiology has been consulted - He is on a heparin drip per their recommendation - Will order an echocardiogram and TSH - His troponins have been flat so far. Will order one more level  2- Obstructive sleep apnea - Continue nightly CPAP  3- hypertension -will hold his amlodipine and lisinopril as  his medications may need to change to something rate controlling.  His blood pressure is well-controlled on the Cardizem drip at present    Advance Care Planning:   Code Status: Full Code the patient names his significant other as his surrogate decision-maker.  If he ever has a full cardiac arrest outside of this hospitalization he wants to be DNR DNI.  For the purposes of this hospitalization he will be full code to participate in any cardiac procedures necessary.  Consults: [facility] cardiology  Family Communication: The patient's significant other was present during my evaluation.  All of her questions were answered.  Severity of Illness: The appropriate patient status for this patient is INPATIENT. Inpatient status is judged to be reasonable and necessary in order to provide the required intensity of service to ensure the patient's safety. The patient's presenting symptoms, physical exam findings, and initial radiographic and laboratory data in the context of their chronic comorbidities is felt to place them at high risk for further clinical deterioration. Furthermore, it is not anticipated that the patient will be medically stable for discharge from the hospital within 2 midnights of admission.   * I certify that at the point of admission it is my clinical judgment that the patient will require inpatient hospital care spanning beyond 2 midnights from the point of admission due to high intensity of service, high risk for further deterioration and high frequency of surveillance required.*  Author: Buena Irish, MD 04/09/2023 12:10 AM  For on call review [email or web address].

## 2023-04-09 NOTE — Progress Notes (Signed)
  Echocardiogram 2D Echocardiogram has been performed.  Tim [name] 04/09/2023, 2:38 PM

## 2023-04-09 NOTE — TOC Benefit Eligibility Note (Signed)
Patient Product/process development scientist completed.    The patient is currently admitted and upon discharge could be taking Elquis 5 mg.  The current 30 day co-pay is $110.00.   The patient is currently admitted and upon discharge could be taking Xarelto 20 mg.  The current 30 day co-pay is $110.00.   The patient is insured through [employer]   This test claim was processed through [employer]- copay amounts may vary at other pharmacies due to [employer], or as the patient moves through the different stages of their insurance plan.  Roland Earl, CPHT Pharmacy Patient Advocate Specialist [facility] Pharm[facility] Patient Advocate Team Direct Number: [phone]  Fax: [phone]

## 2023-04-09 NOTE — Consult Note (Addendum)
Cardiology Consultation   Patient ID: Tim [name] MRN: [id number]; DOB: [birth year]  Admit date: 04/08/2023 Date of Consult: 04/09/2023  PCP:  Aviva Kluver   [facility] Providers Cardiologist:  Nanetta Batty, MD      Patient Profile:   Tim [name] is a 57 y.o. [name] with a hx of HTN, and OSA who is being seen 04/09/2023 for the evaluation of new onset atrial flutter at the request of Dr. Gasper Sells.  History of Present Illness:   Tim [name] is a 57 yo [name] with PMH noted above. He has been seen by cardiology through [facility] 05/2022 and underwent work for chest pain with stress echo which was normal. Also wore a Holter monitor showing episodes of nocturnal bradycardia and junctional escape rhythm, and NSVT.   He is a Naval [employer]. Presented to the ED on 5/21 with concerns for atrial fibrillation. Reports that he developed palpitations and the feeling of his heart racing around 9am. Also had light-headedness and mild heaviness in his chest. Symptoms were not severe and he was able to go to work. Later that evening he check his apple watch and this reported he was in atrial fibrillation.   In the ED his labs showed sodium 137, potassium 3.8, creatinine 1, BNP 141, high-sensitivity troponin 14>> 16>> 9, WBC 10.2, hemoglobin 17, TSH 2.2.  EKG showed 2:1 atrial flutter, 155 bpm.  Chest x-ray negative.  He was placed on IV Cardizem with a bolus as well as IV heparin and admitted to internal medicine for further management.  Cardiology now asked to evaluate.  In talking with patient, he reports seldom tobacco use but does drink 12-18 beers on the weekend on a regular basis. Denies any family hx of Afib.    Past Medical History:  Diagnosis Date   Hypertension     Past Surgical History:  Procedure Laterality Date   CHOLECYSTECTOMY       Home Medications:  Prior to Admission medications   Medication Sig Start Date End Date Taking? Authorizing Provider  amLODipine (NORVASC) 5  MG tablet Take 5 mg by mouth daily.  04/17/14  Yes [provider]  Ascorbic Acid (VITAMIN C PO) Take 1 tablet by mouth daily.   Yes [provider]  b complex vitamins tablet Take 1 tablet by mouth daily.   Yes [provider]  cholecalciferol (VITAMIN D) 1000 UNITS tablet Take 1,000 Units by mouth daily.   Yes [provider]  dicyclomine (BENTYL) 20 MG tablet Take 20 mg by mouth 2 (two) times daily.   Yes [provider]  lisinopril-hydrochlorothiazide (ZESTORETIC) 20-25 MG tablet Take 1 tablet by mouth daily. 03/06/23  Yes [provider]  montelukast (SINGULAIR) 10 MG tablet Take 10 mg by mouth daily. 02/19/23  Yes [provider]  tamsulosin (FLOMAX) 0.4 MG CAPS capsule Take 0.4 mg by mouth at bedtime. 02/13/23  Yes [provider]    Inpatient Medications: Scheduled Meds:  apixaban  5 mg Oral BID   carvedilol  3.125 mg Oral BID WC   Continuous Infusions:  diltiazem (CARDIZEM) infusion Stopped (04/09/23 0008)   PRN Meds:   Allergies:   No Known Allergies  Social History:   Social History   Socioeconomic History   Marital status: Single    Spouse name: Not on file   Number of children: Not on file   Years of education: Not on file   Highest education level: Not on file  Occupational History  Not on file  Tobacco Use   Smoking status: Light Smoker    Types: Cigars   Smokeless tobacco: Not on file  Substance and Sexual Activity   Alcohol use: Yes    Comment: occ   Drug use: No   Sexual activity: Not on file  Other Topics Concern   Not on file  Social History Narrative   Not on file   Social Determinants of Health   Financial Resource Strain: Not on file  Food Insecurity: Not on file  Transportation Needs: Not on file  Physical Activity: Not on file  Stress: Not on file  Social Connections: Not on file  Intimate Partner Violence: Not on file    Family History:   History reviewed. No  pertinent family history.   ROS:  Please see the history of present illness.   All other ROS reviewed and negative.     Physical Exam/Data:   Vitals:   04/09/23 0808 04/09/23 0810 04/09/23 0907 04/09/23 1210  BP: 111/73  117/80 124/89  Pulse: 61  (!) 56 63  Resp: 14  18 17   Temp:  98 F (36.7 C) 97.8 F (36.6 C)   TempSrc:  Oral Oral   SpO2: 99%  98% 99%  Weight:      Height:        Intake/Output Summary (Last 24 hours) at 04/09/2023 1220 Last data filed at 04/09/2023 0008 Gross per 24 hour  Intake 29.67 ml  Output --  Net 29.67 ml      04/08/2023    5:46 PM  Last 3 Weights  Weight (lbs) 300 lb  Weight (kg) 136.079 kg     Body mass index is 37.5 kg/m.  General:  Well nourished, well developed, in no acute distress HEENT: normal Neck: no JVD Vascular: No carotid bruits; Distal pulses 2+ bilaterally Cardiac:  normal S1, S2; RRR; no murmur  Lungs:  clear to auscultation bilaterally, no wheezing, rhonchi or rales  Abd: soft, nontender, no hepatomegaly  Ext: no edema Musculoskeletal:  No deformities, BUE and BLE strength normal and equal Skin: warm and dry  Neuro:  CNs 2-12 intact, no focal abnormalities noted Psych:  Normal affect   EKG:  The EKG was personally reviewed and demonstrates:  2:1 atrial flutter, 155 bpm Telemetry:  Telemetry was personally reviewed and demonstrates:  sinus rhythm, some episodes of sinus bradycardia  Relevant CV Studies:  Echo: pending  Laboratory Data:  High Sensitivity Troponin:   Recent Labs  Lab 04/08/23 1802 04/08/23 2005 04/09/23 0530  TROPONINIHS 14 16 9      Chemistry Recent Labs  Lab 04/08/23 1802 04/09/23 0114  NA 137 138  K 3.8 3.3*  CL 100 104  CO2 23 24  GLUCOSE 129* 172*  BUN 20 20  CREATININE 1.00 0.86  CALCIUM 9.3 8.6*  MG  --  2.1  GFRNONAA >60 >60  ANIONGAP 14 10    No results for input(s): "PROT", "ALBUMIN", "AST", "ALT", "ALKPHOS", "BILITOT" in the last 168 hours. Lipids No results for  input(s): "CHOL", "TRIG", "HDL", "LABVLDL", "LDLCALC", "CHOLHDL" in the last 168 hours.  Hematology Recent Labs  Lab 04/08/23 1802 04/09/23 0114  WBC 10.2 8.7  RBC 5.50 5.12  HGB 17.0 15.5  HCT 50.0 46.2  MCV 90.9 90.2  MCH 30.9 30.3  MCHC 34.0 33.5  RDW 12.6 12.7  PLT 260 212   Thyroid  Recent Labs  Lab 04/09/23 0114  TSH 2.272    BNP Recent Labs  Lab 04/08/23 1802  BNP 141.9*    DDimer No results for input(s): "DDIMER" in the last 168 hours.   Radiology/Studies:  DG Chest Port 1 View  Result Date: 04/08/2023 CLINICAL DATA:  Chest pain EXAM: PORTABLE CHEST 1 VIEW COMPARISON:  05/01/2014 x-ray series FINDINGS: No consolidation, pneumothorax or effusion. No edema. Normal cardiopericardial silhouette. Overlapping cardiac leads. Films are under penetrated. IMPRESSION: No acute cardiopulmonary disease Electronically Signed   By: Karen Kays M.D.   On: 04/08/2023 18:27     Assessment and Plan:   Tim [name] is a 57 y.o. [name] with a hx of HTN, and OSA who is being seen 04/09/2023 for the evaluation of new onset atrial flutter at the request of Dr. Gasper Sells.  New onset atrial flutter -- developed episode yesterday morning around 9am that persisted throughout the day. Noted to be atrial flutter with RVR on arrival to the ED. Placed on IV Dilt gtt with conversion to sinus rhythm around midnight. Has been in sinus rhythm since that time.  -- ChadsVasc of 1, likely Eliquis 5mg  BID for 4 weeks then stop  -- echo pending  -- he uses his apple watch on a regular basis and aware of atrial flutter rhythm when present, therefore will defer cardiac monitor at this time   HTN -- has been on amlodipine and lisinopril PTA -- initially placed on IV Dilt, converted to SR. Has been transitioned to coreg 6.25mg  BID. Has had some episodes of bradycardia, reduce dose to 3.125mg  BID   OSA -- reports compliance with Cpap  Tobacco Use ETOH use -- discussed the need for reduction in ETOH  consumption   He prefers to follow up with Heartcare, will arrange for follow up.   Risk Assessment/Risk Scores:   CHA2DS2-VASc Score = 1  This indicates a 0.6% annual risk of stroke. 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  He was admitted with a flutter with rapid ventricular response which he was symptomatic from.  He had previously been evaluated at [facility] and had a normal stress echo.  He also has obstructive sleep apnea on CPAP.  He was converted to sinus rhythm on IV diltiazem.  2D echo is pending.  He feels clinically improved.  Does admit to alcohol usage on the weekends which we have discussed as a trigger for atrial tachyarrhythmias.  His exam is benign. The CHA2DSVASC2 score is 1  .  I do not think he needs long-term oral anticoagulation.  Will put him on Eliquis 5 mg p.o. twice daily for 1 month after which she can discontinue this.  Once his echo is complete and if it is normal he can be discharged home.  Will arrange outpatient follow-up.  Runell Gess, M.D., FACP, [facility], [facility] [address] [place], [place]  [address]  [phone] 04/09/2023 12:50 PM

## 2023-04-09 NOTE — Progress Notes (Signed)
ANTICOAGULATION CONSULT NOTE  Pharmacy Consult for heparin Indication: atrial fibrillation  No Known Allergies  Patient Measurements: Height: 6\' 3"  (190.5 cm) Weight: 136.1 kg (300 lb) IBW/kg (Calculated) : 84.5 Heparin Dosing Weight: 115kg  Vital Signs: Temp: 97.7 F (36.5 C) (05/22 0211) Temp Source: Oral (05/22 0211) BP: 94/58 (05/22 0157) Pulse Rate: 61 (05/22 0157)  Labs: Recent Labs    04/08/23 1802 04/08/23 2005 04/09/23 0114  HGB 17.0  --  15.5  HCT 50.0  --  46.2  PLT 260  --  212  HEPARINUNFRC  --   --  0.19*  CREATININE 1.00  --  0.86  TROPONINIHS 14 16  --     Estimated Creatinine Clearance: 140.9 mL/min (by C-G formula based on SCr of 0.86 mg/dL).   Medical History: Past Medical History:  Diagnosis Date   Hypertension      Assessment: 67 YOM presenting with CP and SOB, in Afib, he is not on anticoagulation PTA  5/22 AM update:  Heparin level sub-therapeutic   Goal of Therapy:  Heparin level 0.3-0.7 units/ml Monitor platelets by anticoagulation protocol: Yes   Plan:  Heparin 3000 units re-bolus Inc heparin to 1800 units/hr 1000 heparin level  Abran Duke, PharmD, BCPS Clinical Pharmacist Phone: [phone]

## 2023-04-10 ENCOUNTER — Encounter: Payer: Self-pay | Admitting: Student

## 2023-04-10 NOTE — Progress Notes (Signed)
Cardiology Office Note:    Date:  04/17/2023   ID:  Tim [name], DOB May 26, 1966, MRN [id number]  PCP:  Venetia Constable, MD  Cardiologist:  Nanetta Batty, MD  Electrophysiologist:  None   Referring MD: No ref. provider found   Chief Complaint: hospital follow-up of atrial flutter  History of Present Illness:    Tim [name] is a 57 y.o. male with a history of paroxysmal atrial flutter on Eliquis, hypertension, obstructive sleep apnea,  obesity, tobacco abuse, and alcohol abuse who is followed by Dr. Allyson Sabal and presents today for hospital follow-up of atrial flutter.  Patient was first seen by Cardiology during recent admission. He was admitted from 04/08/2023 to 04/09/2023 for new onset atrial flutter with RVR. He was placed on IV Diltiazem and converted to sinus rhythm. Echo showed LVEF of 45-50% with global hypokinesis and mild dilatation of the ascending aorta measuring 40 mm. He was started on Eliquis with plans to continue this for one month and then stop given low CHA2DS2-VASc score of 1 (HTN). He was counseled on the need to reduce alcohol consumption as this can trigger tachyarrhythmias.   Patient presents today for follow-up. He denies any recurrent palpitations or recurrent notification of an abnormal rhythm on his Apple Watch. He reports occasional episodes where it feels like he cannot take a deep breath and then he develops a very mild "weight" on his chest because he feels like he cannot breathe. He states that he has had this since he had COVID. His PCP gave him an inhaler and symptoms resolve with this. No other significant shortness of breath or chest pain/ discomfort. This does not sound like angina - it sounds pulmonary in nature. His PCP gave inhaler, No orthopnea, PND, edema, lightheadedness, dizziness, edema. No abnormal bleeding on Eliquis. His only real complaint today is diffuse stiffness and soreness of his back after Meloxicam was stopped in the hospital due to  need for Eliquis. We discussed the reasoning for this and he understands.   Past Medical History:  Diagnosis Date   Atrial flutter (HCC)    Hypertension    Obstructive sleep apnea    Tobacco abuse     Past Surgical History:  Procedure Laterality Date   CHOLECYSTECTOMY      Current Medications: Current Meds  Medication Sig   apixaban (ELIQUIS) 5 MG TABS tablet Take 1 tablet (5 mg total) by mouth 2 (two) times daily.   Ascorbic Acid (VITAMIN C PO) Take 1 tablet by mouth daily.   b complex vitamins tablet Take 1 tablet by mouth daily.   Budeson-Glycopyrrol-Formoterol (BREZTRI AEROSPHERE) 160-9-4.8 MCG/ACT AERO Inhale into the lungs as needed.   carvedilol (COREG) 3.125 MG tablet Take 1 tablet (3.125 mg total) by mouth 2 (two) times daily with a meal.   cholecalciferol (VITAMIN D) 1000 UNITS tablet Take 1,000 Units by mouth daily.   dicyclomine (BENTYL) 20 MG tablet Take 20 mg by mouth 2 (two) times daily.   lisinopril-hydrochlorothiazide (ZESTORETIC) 20-25 MG tablet Take 1 tablet by mouth daily.   montelukast (SINGULAIR) 10 MG tablet Take 10 mg by mouth daily.   sildenafil (REVATIO) 20 MG tablet Take 20 mg by mouth 3 (three) times daily as needed.   tamsulosin (FLOMAX) 0.4 MG CAPS capsule Take 0.4 mg by mouth at bedtime.   testosterone cypionate (DEPOTESTOSTERONE CYPIONATE) 200 MG/ML injection Inject 200 mg into the muscle as needed. PATIENT INJECTS 1/2 ML EVERY 10 DAYS     Allergies:  Patient has no known allergies.   Social History   Socioeconomic History   Marital status: Single    Spouse name: Not on file   Number of children: Not on file   Years of education: Not on file   Highest education level: Not on file  Occupational History   Not on file  Tobacco Use   Smoking status: Light Smoker    Types: Cigars   Smokeless tobacco: Not on file  Substance and Sexual Activity   Alcohol use: Yes    Comment: occ   Drug use: No   Sexual activity: Not on file  Other Topics  Concern   Not on file  Social History Narrative   Not on file   Social Determinants of Health   Financial Resource Strain: Not on file  Food Insecurity: Not on file  Transportation Needs: Not on file  Physical Activity: Not on file  Stress: Not on file  Social Connections: Not on file     Family History: The patient's family history is not on file.  ROS:   Please see the history of present illness.     EKGs/Labs/Other Studies Reviewed:    The following studies were reviewed:  Echocardiogram 04/09/2023: Impressions: 1. Left ventricular ejection fraction, by estimation, is 45 to 50%. The  left ventricle has mildly decreased function. The left ventricle  demonstrates global hypokinesis. There is mild left ventricular  hypertrophy. Left ventricular diastolic parameters  were normal.   2. Right ventricular systolic function is normal. The right ventricular  size is mildly enlarged.   3. The mitral valve is normal in structure. Trivial mitral valve  regurgitation. No evidence of mitral stenosis.   4. The aortic valve is normal in structure. Aortic valve regurgitation is  not visualized. No aortic stenosis is present.   5. Aortic dilatation noted. There is mild dilatation of the ascending  aorta, measuring 40 mm.   6. The inferior vena cava is normal in size with greater than 50%  respiratory variability, suggesting right atrial pressure of 3 mmHg.    EKG:  EKG ordered today. EKG personally reviewed and demonstrates sinus bradycardia, rate 46 bpm, with no acute ST/T changes. Normal axis. Normal PR and QRS intervals. QTc 374. Marland Kitchen  Recent Labs: 04/08/2023: B Natriuretic Peptide 141.9 04/09/2023: BUN 20; Creatinine, Ser 0.86; Hemoglobin 15.5; Magnesium 2.1; Platelets 212; Potassium 3.3; Sodium 138; TSH 2.272  Recent Lipid Panel No results found for: "CHOL", "TRIG", "HDL", "CHOLHDL", "VLDL", "LDLCALC", "LDLDIRECT"  Physical Exam:    Vital Signs: BP 128/88 (BP Location: Right  Arm, Patient Position: Sitting, Cuff Size: Large)   Pulse (!) 54   Ht 6\' 3"  (1.905 m)   Wt (!) 317 lb 9.6 oz (144.1 kg)   SpO2 97%   BMI 39.70 kg/m     Wt Readings from Last 3 Encounters:  04/17/23 (!) 317 lb 9.6 oz (144.1 kg)  04/08/23 300 lb (136.1 kg)     General: 57 y.o. Caucasian male in no acute distress. HEENT: Normocephalic and atraumatic. Sclera clear.  Neck: Supple. No JVD. Heart: Bradycardic with normal rhythm.  Distinct S1 and S2. No murmurs, gallops, or rubs. Radial and distal pedal pulses 2+ and equal bilaterally. Lungs: No increased work of breathing. Clear to ausculation bilaterally. No wheezes, rhonchi, or rales.  Extremities: No lower extremity edema.    Skin: Warm and dry. Neuro: Alert and oriented x3. No focal deficits. Psych: Normal affect. Responds appropriately.  Assessment:    1.  Atrial flutter, unspecified type (HCC)   2. Mild left ventricular systolic dysfunction   3. Primary hypertension   4. Obstructive sleep apnea     Plan:    Paroxysmal Atrial Flutter Patient was recently admitted for new onset atrial flutter with RVR but converted back to sinus rhythm with IV Diltiazem. Echo showed LVEF of 45-50% with global hypokinesis.  - Maintaining sinus rhythm today. Rates in the high 40s to 50s. Asymptomatic with this.  - Continue Coreg 3.125mg  twice daily.  - CHA2DS2-VASc = 1 (HNT). Plans is to continue to continue anticoagulation with Eliquis 5mg  twice daily for 1 month and then stop. - Will repeat Echo to reassess LV function now that he is back in sinus rhythm.   Mild LV Dysfunction Echo during recent admission showed LVEF of 45-50% with global hypokinesis. Suspect this is tachy-mediated in nature due to rapid atrial flutter.  - No signs of symptoms of CHF. Euvolemic on exam.  - Continue Coreg 3.125mg  twice daily.  - Continue Lisinopril-HCZ 20-25mg  daily.  - Will repeat Echo now that he is back in sinus rhythm.   Hypertension BP borderline  elevated at 128/88 today. - Continue current medications: Coreg 3.125mg  twice daily and Lisinopril-HCTZ 20-25mg  daily. - He was previously on Amlodipine but this was discontinued during recent hospitalization. He states he has a follow-up visit with his PCP in about a month. Recommend checking his BP a couple of times a week and bring a log of his BP to this visit. If average BP above goal, may need to restart Amlodipine.  Obstructive Sleep Apnea - Continue CPAP.  Disposition: Follow up in 6 months.    Medication Adjustments/Labs and Tests Ordered: Current medicines are reviewed at length with the patient today.  Concerns regarding medicines are outlined above.  Orders Placed This Encounter  Procedures   EKG 12-Lead   ECHOCARDIOGRAM COMPLETE   No orders of the defined types were placed in this encounter.   Patient Instructions  Medication Instructions:  YOU MAY STOP ELIQUIS WHEN YOUR CURRENT BOTTLE IS FINISHED *If you need a refill on your cardiac medications before your next appointment, please call your pharmacy*  Lab Work: NONE If you have labs (blood work) drawn today and your tests are completely normal, you will receive your results only by: MyChart Message (if you have MyChart) OR A paper copy in the mail If you have any lab test that is abnormal or we need to change your treatment, we will call you to review the results.   Testing/Procedures: Echocardiogram (YOU MAY HAVE THIS DONE THIS SUMMER)- Your physician has requested that you have an echocardiogram. Echocardiography is a painless test that uses sound waves to create images of your heart. It provides your doctor with information about the size and shape of your heart and how well your heart's chambers and valves are working. This procedure takes approximately one hour. There are no restrictions for this procedure.    Follow-Up: At [facility], you and your health needs are our priority.  As part of our  continuing mission to provide you with exceptional heart care, we have created designated Provider Care Teams.  These Care Teams include your primary Cardiologist (physician) and Advanced Practice Providers (APPs -  Physician Assistants and Nurse Practitioners) who all work together to provide you with the care you need, when you need it.  Your next appointment:   6 month(s)  Provider:   Nanetta Batty, MD     Other  Instructions    Signed, Corrin Parker, PA-C  04/17/2023 9:10 AM    [facility]

## 2023-04-17 ENCOUNTER — Ambulatory Visit: Payer: 59 | Attending: Student | Admitting: Student

## 2023-04-17 ENCOUNTER — Encounter: Payer: Self-pay | Admitting: Student

## 2023-04-17 VITALS — BP 128/88 | HR 54 | Ht 75.0 in | Wt 317.6 lb

## 2023-04-17 DIAGNOSIS — I4892 Unspecified atrial flutter: Secondary | ICD-10-CM | POA: Diagnosis not present

## 2023-04-17 DIAGNOSIS — I5189 Other ill-defined heart diseases: Secondary | ICD-10-CM | POA: Diagnosis not present

## 2023-04-17 DIAGNOSIS — I10 Essential (primary) hypertension: Secondary | ICD-10-CM | POA: Diagnosis not present

## 2023-04-17 DIAGNOSIS — G4733 Obstructive sleep apnea (adult) (pediatric): Secondary | ICD-10-CM

## 2023-04-17 NOTE — Patient Instructions (Signed)
Medication Instructions:  YOU MAY STOP ELIQUIS WHEN YOUR CURRENT BOTTLE IS FINISHED *If you need a refill on your cardiac medications before your next appointment, please call your pharmacy*  Lab Work: NONE If you have labs (blood work) drawn today and your tests are completely normal, you will receive your results only by: MyChart Message (if you have MyChart) OR A paper copy in the mail If you have any lab test that is abnormal or we need to change your treatment, we will call you to review the results.   Testing/Procedures: Echocardiogram (YOU MAY HAVE THIS DONE THIS SUMMER)- Your physician has requested that you have an echocardiogram. Echocardiography is a painless test that uses sound waves to create images of your heart. It provides your doctor with information about the size and shape of your heart and how well your heart's chambers and valves are working. This procedure takes approximately one hour. There are no restrictions for this procedure.    Follow-Up: At [facility], you and your health needs are our priority.  As part of our continuing mission to provide you with exceptional heart care, we have created designated Provider Care Teams.  These Care Teams include your primary Cardiologist (physician) and Advanced Practice Providers (APPs -  Physician Assistants and Nurse Practitioners) who all work together to provide you with the care you need, when you need it.  Your next appointment:   6 month(s)  Provider:   Nanetta Batty, MD     Other Instructions

## 2023-04-18 ENCOUNTER — Ambulatory Visit: Payer: 59 | Admitting: Student

## 2023-05-15 ENCOUNTER — Other Ambulatory Visit: Payer: Self-pay

## 2023-06-27 ENCOUNTER — Ambulatory Visit (HOSPITAL_COMMUNITY): Payer: 59

## 2023-07-11 ENCOUNTER — Other Ambulatory Visit: Payer: Self-pay | Admitting: Medical Genetics

## 2023-07-11 DIAGNOSIS — Z006 Encounter for examination for normal comparison and control in clinical research program: Secondary | ICD-10-CM

## 2023-07-17 ENCOUNTER — Ambulatory Visit (HOSPITAL_BASED_OUTPATIENT_CLINIC_OR_DEPARTMENT_OTHER): Payer: 59

## 2023-07-17 ENCOUNTER — Other Ambulatory Visit (HOSPITAL_COMMUNITY)
Admission: RE | Admit: 2023-07-17 | Discharge: 2023-07-17 | Disposition: A | Discharge status: Home / Self Care | Payer: 59 | Source: Ambulatory Visit | Attending: Student | Admitting: Student

## 2023-07-17 DIAGNOSIS — I5189 Other ill-defined heart diseases: Secondary | ICD-10-CM

## 2023-07-17 DIAGNOSIS — I4892 Unspecified atrial flutter: Secondary | ICD-10-CM | POA: Insufficient documentation

## 2023-07-17 DIAGNOSIS — E669 Obesity, unspecified: Secondary | ICD-10-CM | POA: Insufficient documentation

## 2023-07-17 DIAGNOSIS — F17200 Nicotine dependence, unspecified, uncomplicated: Secondary | ICD-10-CM | POA: Diagnosis not present

## 2023-07-17 DIAGNOSIS — I4891 Unspecified atrial fibrillation: Secondary | ICD-10-CM | POA: Insufficient documentation

## 2023-07-17 DIAGNOSIS — G4730 Sleep apnea, unspecified: Secondary | ICD-10-CM | POA: Insufficient documentation

## 2023-07-17 LAB — ECHOCARDIOGRAM COMPLETE
Area-P 1/2: 4.46 cm2
S' Lateral: 4 cm

## 2023-10-01 ENCOUNTER — Ambulatory Visit: Payer: 59 | Attending: Cardiovascular Disease | Admitting: Cardiovascular Disease

## 2023-10-01 ENCOUNTER — Encounter: Payer: Self-pay | Admitting: Cardiovascular Disease

## 2023-10-01 VITALS — BP 121/80 | HR 49 | Ht 75.0 in | Wt 321.0 lb

## 2023-10-01 DIAGNOSIS — R079 Chest pain, unspecified: Secondary | ICD-10-CM

## 2023-10-01 DIAGNOSIS — I10 Essential (primary) hypertension: Secondary | ICD-10-CM | POA: Diagnosis not present

## 2023-10-01 DIAGNOSIS — I4891 Unspecified atrial fibrillation: Secondary | ICD-10-CM

## 2023-10-01 DIAGNOSIS — G4733 Obstructive sleep apnea (adult) (pediatric): Secondary | ICD-10-CM

## 2023-10-01 DIAGNOSIS — E782 Mixed hyperlipidemia: Secondary | ICD-10-CM | POA: Diagnosis not present

## 2023-10-01 DIAGNOSIS — E785 Hyperlipidemia, unspecified: Secondary | ICD-10-CM | POA: Insufficient documentation

## 2023-10-01 NOTE — Assessment & Plan Note (Signed)
History of obstructive sleep apnea on CPAP. 

## 2023-10-01 NOTE — Progress Notes (Signed)
10/01/2023 Ebbie Ridge   [birth year]  [id number]  Primary Physician Lyn Hollingshead, Faythe Casa, MD Primary Cardiologist: Runell Gess MD Nicholes Calamity, [place]  HPI:  Tim [name] is a 57 y.o. workmates married Caucasian male father of 2 stepchildren, grandfather of 3 grandchildren who is accompanied by his wife Naomi today.  I last saw him during his hospitalization 04/09/2023 when he came in with A-fib with RVR and converted pharmacologically.  He is placed on Eliquis for 30 days which was since discontinued.  2D echo at that time was normal.  His other problems include treated hypertension and untreated hyperlipidemia.  He smoked 20 pack years and stopped 20 years ago.  There is no family history of heart disease.  Never had a heart attack or stroke.  He has had some atypical chest pain 3 weeks ago which lasted for 2 days and was characterized as squeezing and resolved spontaneously.  Is also had a fleeting episode of substernal chest burning which lasted less than 10 seconds.  He does wear an Apple Watch which consistently says that his A-fib burden is "less than 2%".  He feels episodes of dizziness for unclear reasons.  He does continue to drink alcohol particularly in the weekends.   Current Meds  Medication Sig   amLODipine (NORVASC) 5 MG tablet Take 5 mg by mouth daily.   Ascorbic Acid (VITAMIN C PO) Take 1 tablet by mouth daily.   b complex vitamins tablet Take 1 tablet by mouth daily.   Budeson-Glycopyrrol-Formoterol (BREZTRI AEROSPHERE) 160-9-4.8 MCG/ACT AERO Inhale into the lungs as needed.   cholecalciferol (VITAMIN D) 1000 UNITS tablet Take 1,000 Units by mouth daily.   dicyclomine (BENTYL) 20 MG tablet Take 20 mg by mouth 2 (two) times daily.   doxazosin (CARDURA XL) 4 MG 24 hr tablet Take 4 mg by mouth daily with breakfast.   lisinopril-hydrochlorothiazide (ZESTORETIC) 20-25 MG tablet Take 1 tablet by mouth daily.   montelukast (SINGULAIR) 10 MG tablet Take 10 mg by mouth  daily.   sildenafil (REVATIO) 20 MG tablet Take 20 mg by mouth 3 (three) times daily as needed.   testosterone cypionate (DEPOTESTOSTERONE CYPIONATE) 200 MG/ML injection Inject 200 mg into the muscle as needed. PATIENT INJECTS 1/2 ML EVERY 10 DAYS     No Known Allergies  Social History   Socioeconomic History   Marital status: Single    Spouse name: Not on file   Number of children: Not on file   Years of education: Not on file   Highest education level: Not on file  Occupational History   Not on file  Tobacco Use   Smoking status: Light Smoker    Types: Cigars   Smokeless tobacco: Not on file  Substance and Sexual Activity   Alcohol use: Yes    Comment: occ   Drug use: No   Sexual activity: Not on file  Other Topics Concern   Not on file  Social History Narrative   Not on file   Social Determinants of Health   Financial Resource Strain: Low Risk  (12/01/2022)   [facility], [facility]   Overall Financial Resource Strain (CARDIA)    Difficulty of Paying Living Expenses: Not hard at all  Food Insecurity: No Food Insecurity (12/01/2022)   Received from [facility], [facility]   Hunger Vital Sign    Worried About Running Out of Food in the Last Year: Never true    Ran Out of Food  in the Last Year: Never true  Transportation Needs: No Transportation Needs (12/01/2022)   Received from [facility], [facility]   [facility] - Transportation    Lack of Transportation (Medical): No    Lack of Transportation (Non-Medical): No  Physical Activity: Unknown (12/01/2022)   Received from [facility], [facility]   Exercise Vital Sign    Days of Exercise per Week: 0 days    Minutes of Exercise per Session: Not on file  Stress: No Stress Concern Present (12/01/2022)   Received from [facility], [facility] - Occupational Stress Questionnaire    Feeling of Stress : Not at all  Social Connections: Socially  Integrated (12/01/2022)   Received from [facility], [facility]   Social Network    How would you rate your social network (family, work, friends)?: Good participation with social networks  Intimate Partner Violence: Not At Risk (12/01/2022)   Received from [facility], [facility]   HITS    Over the last 12 months how often did your partner physically hurt you?: Never    Over the last 12 months how often did your partner insult you or talk down to you?: Never    Over the last 12 months how often did your partner threaten you with physical harm?: Never    Over the last 12 months how often did your partner scream or curse at you?: Never     Review of Systems: General: negative for chills, fever, night sweats or weight changes.  Cardiovascular: negative for chest pain, dyspnea on exertion, edema, orthopnea, palpitations, paroxysmal nocturnal dyspnea or shortness of breath Dermatological: negative for rash Respiratory: negative for cough or wheezing Urologic: negative for hematuria Abdominal: negative for nausea, vomiting, diarrhea, bright red blood per rectum, melena, or hematemesis Neurologic: negative for visual changes, syncope, or dizziness All other systems reviewed and are otherwise negative except as noted above.    Blood pressure 121/80, pulse (!) 49, height 6\' 3"  (1.905 m), weight (!) 321 lb (145.6 kg), SpO2 98%.  General appearance: alert and no distress Neck: no adenopathy, no carotid bruit, no JVD, supple, symmetrical, trachea midline, and thyroid not enlarged, symmetric, no tenderness/mass/nodules Lungs: clear to auscultation bilaterally Heart: regular rate and rhythm, S1, S2 normal, no murmur, click, rub or gallop Extremities: extremities normal, atraumatic, no cyanosis or edema Pulses: 2+ and symmetric Skin: Skin color, texture, turgor normal. No rashes or lesions Neurologic: Grossly normal  EKG EKG Interpretation Date/Time:  Wednesday October 01 2023  10:34:57 EST Ventricular Rate:  49 PR Interval:  172 QRS Duration:  96 QT Interval:  418 QTC Calculation: 377 R Axis:   5  Text Interpretation: Sinus bradycardia When compared with ECG of 09-Apr-2023 00:28, PREVIOUS ECG IS PRESENT Confirmed by Nanetta Batty [phone]) on 10/01/2023 10:52:54 AM    ASSESSMENT AND PLAN:   OSA (obstructive sleep apnea) History of obstructive sleep apnea on CPAP  Atrial fibrillation with rapid ventricular response (HCC) History of PAF status post admission back in May 2024 with RVR.  He converted on IV diltiazem.  He has not had recurrence clinically since.  He does wear an Scientist, physiological.  He has been having episodes of dizziness for unclear reasons.  Am going to get a 30-day Zio patch to further evaluate.  Hyperlipidemia Hyperlipidemia with lab work performed 07/03/2023 by his PCP revealing a total cholesterol 177, LDL 120 and HDL 45.  I am going to get a coronary calcium  score to stratify him especially in light of his atypical chest pain as well.  Morbid obesity (HCC) Morbid obesity with a BMI of 40.     Runell Gess MD FACP,FACC,FAHA, [facility] 10/01/2023 11:13 AM

## 2023-10-01 NOTE — Patient Instructions (Addendum)
Testing/Procedures:  Tim Batty MD has ordered a CT coronary calcium score.   Test locations:  [facility]  [facility] at [facility]  This is $99 out of pocket.   Coronary CalciumScan A coronary calcium scan is an imaging test used to look for deposits of calcium and other fatty materials (plaques) in the inner lining of the blood vessels of the heart (coronary arteries). These deposits of calcium and plaques can partly clog and narrow the coronary arteries without producing any symptoms or warning signs. This puts a person at risk for a heart attack. This test can detect these deposits before symptoms develop. Tell a health care provider about: Any allergies you have. All medicines you are taking, including vitamins, herbs, eye drops, creams, and over-the-counter medicines. Any problems you or family members have had with anesthetic medicines. Any blood disorders you have. Any surgeries you have had. Any medical conditions you have. Whether you are pregnant or may be pregnant. What are the risks? Generally, this is a safe procedure. However, problems may occur, including: Harm to a pregnant woman and her unborn baby. This test involves the use of radiation. Radiation exposure can be dangerous to a pregnant woman and her unborn baby. If you are pregnant, you generally should not have this procedure done. Slight increase in the risk of cancer. This is because of the radiation involved in the test. What happens before the procedure? No preparation is needed for this procedure. What happens during the procedure? You will undress and remove any jewelry around your neck or chest. You will put on a hospital gown. Sticky electrodes will be placed on your chest. The electrodes will be connected to an electrocardiogram (ECG) machine to record a tracing of the electrical activity of your heart. A CT scanner  will take pictures of your heart. During this time, you will be asked to lie still and hold your breath for 2-3 seconds while a picture of your heart is being taken. The procedure may vary among health care providers and hospitals. What happens after the procedure? You can get dressed. You can return to your normal activities. It is up to you to get the results of your test. Ask your health care provider, or the department that is doing the test, when your results will be ready. Summary A coronary calcium scan is an imaging test used to look for deposits of calcium and other fatty materials (plaques) in the inner lining of the blood vessels of the heart (coronary arteries). Generally, this is a safe procedure. Tell your health care provider if you are pregnant or may be pregnant. No preparation is needed for this procedure. A CT scanner will take pictures of your heart. You can return to your normal activities after the scan is done. This information is not intended to replace advice given to you by your health care provider. Make sure you discuss any questions you have with your health care provider. Document Released: 05/02/2008 Document Revised: 09/23/2016 Document Reviewed: 09/23/2016 Elsevier Interactive Patient Education  2017 Elsevier Inc.   [employer] Cardiac Event Monitor Instructions  Your physician has requested you wear your cardiac event monitor for ___30__ days, (1-30). [employer] may call or text to confirm a shipping address. The monitor will be sent to a land address via UPS. [employer] will not ship a monitor to a PO BOX. It typically takes 3-5 days to receive your monitor after it has been  enrolled. [employer] will assist with USPS tracking if your package is delayed. The telephone number for [employer] is [phone]. Once you have received your monitor, please review the enclosed instructions. Instruction tutorials can also be viewed under help and settings on the  enclosed cell phone. Your monitor has already been registered assigning a specific monitor serial # to you.  Billing and Self Pay Discount Information  [employer] has been provided the insurance information we had on file for you.  If your insurance has been updated, please call [employer] at [phone] to provide them with your updated insurance information.   [employer] offers a discounted Self Pay option for patients who have insurance that does not cover their cardiac event monitor or patients without insurance.  The discounted cost of a Self Pay Cardiac Event Monitor would be $225.00 , if the patient contacts [employer] at [phone] within 7 days of applying the monitor to make payment arrangements.  If the patient does not contact [employer] within 7 days of applying the monitor, the cost of the cardiac event monitor will be $350.00.  Applying the monitor  Remove cell phone from case and turn it on. The cell phone works as IT consultant and needs to be within [employer] of you at all times. The cell phone will need to be charged on a daily basis. We recommend you plug the cell phone into the enclosed charger at your bedside table every night.  Monitor batteries: You will receive two monitor batteries labelled #1 and #2. These are your recorders. Plug battery #2 onto the second connection on the enclosed charger. Keep one battery on the charger at all times. This will keep the monitor battery deactivated. It will also keep it fully charged for when you need to switch your monitor batteries. A small light will be blinking on the battery emblem when it is charging. The light on the battery emblem will remain on when the battery is fully charged.  Open package of a Monitor strip. Insert battery #1 into black hood on strip and gently squeeze monitor battery onto connection as indicated in instruction booklet. Set aside while preparing skin.  Choose location for your strip, vertical or  horizontal, as indicated in the instruction booklet. Shave to remove all hair from location. There cannot be any lotions, oils, powders, or colognes on skin where monitor is to be applied. Wipe skin clean with enclosed Saline wipe. Dry skin completely.  Peel paper labeled #1 off the back of the Monitor strip exposing the adhesive. Place the monitor on the chest in the vertical or horizontal position shown in the instruction booklet. One arrow on the monitor strip must be pointing upward. Carefully remove paper labeled #2, attaching remainder of strip to your skin. Try not to create any folds or wrinkles in the strip as you apply it.  Firmly press and release the circle in the center of the monitor battery. You will hear a small beep. This is turning the monitor battery on. The heart emblem on the monitor battery will light up every 5 seconds if the monitor battery in turned on and connected to the patient securely. Do not push and hold the circle down as this turns the monitor battery off. The cell phone will locate the monitor battery. A screen will appear on the cell phone checking the connection of your monitor strip. This may read poor connection initially but change to good connection within the next minute. Once your monitor accepts the connection you  will hear a series of 3 beeps followed by a climbing crescendo of beeps. A screen will appear on the cell phone showing the two monitor strip placement options. Touch the picture that demonstrates where you applied the monitor strip.  Your monitor strip and battery are waterproof. You are able to shower, bathe, or swim with the monitor on. They just ask you do not submerge deeper than 3 feet underwater. We recommend removing the monitor if you are swimming in a lake, river, or ocean.  Your monitor battery will need to be switched to a fully charged monitor battery approximately once a week. The cell phone will alert you of an action which  needs to be made.  On the cell phone, tap for details to reveal connection status, monitor battery status, and cell phone battery status. The green dots indicates your monitor is in good status. A red dot indicates there is something that needs your attention.  To record a symptom, click the circle on the monitor battery. In 30-60 seconds a list of symptoms will appear on the cell phone. Select your symptom and tap save. Your monitor will record a sustained or significant arrhythmia regardless of you clicking the button. Some patients do not feel the heart rhythm irregularities. [employer] will notify us of any serious or critical events.  Refer to instruction booklet for instructions on switching batteries, changing strips, the Do not disturb or Pause features, or any additional questions.  Call [employer] at [phone], to confirm your monitor is transmitting and record your baseline. They will answer any questions you may have regarding the monitor instructions at that time.  Returning the monitor to [employer]  Place all equipment back into blue box. Peel off strip of paper to expose adhesive and close box securely. There is a prepaid UPS shipping label on this box. Drop in a UPS drop box, or at a UPS facility like [employer]. You may also contact [employer] to arrange UPS to pick up monitor package at your home.  Follow-Up: At [facility], you and your health needs are our priority.  As part of our continuing mission to provide you with exceptional heart care, we have created designated Provider Care Teams.  These Care Teams include your primary Cardiologist (physician) and Advanced Practice Providers (APPs -  Physician Assistants and Nurse Practitioners) who all work together to provide you with the care you need, when you need it.  We recommend signing up for the patient portal called "MyChart".  Sign up information is provided on this After Visit Summary.  MyChart is used  to connect with patients for Virtual Visits (Telemedicine).  Patients are able to view lab/test results, encounter notes, upcoming appointments, etc.  Non-urgent messages can be sent to your provider as well.   To learn more about what you can do with MyChart, go to [email or web address].    Your next appointment:   6 month(s)  Provider:   Marjie Skiff, PA-C    Then, Tim Batty, MD will plan to see you again in 12 month(s).

## 2023-10-01 NOTE — Assessment & Plan Note (Signed)
Hyperlipidemia with lab work performed 07/03/2023 by his PCP revealing a total cholesterol 177, LDL 120 and HDL 45.  I am going to get a coronary calcium score to stratify him especially in light of his atypical chest pain as well.

## 2023-10-01 NOTE — Assessment & Plan Note (Signed)
Morbid obesity with a BMI of 40

## 2023-10-01 NOTE — Assessment & Plan Note (Signed)
History of PAF status post admission back in May 2024 with RVR.  He converted on IV diltiazem.  He has not had recurrence clinically since.  He does wear an Scientist, physiological.  He has been having episodes of dizziness for unclear reasons.  Am going to get a 30-day Zio patch to further evaluate.

## 2023-10-07 DIAGNOSIS — I4891 Unspecified atrial fibrillation: Secondary | ICD-10-CM

## 2023-10-09 ENCOUNTER — Telehealth: Payer: Self-pay | Admitting: Cardiovascular Disease

## 2023-10-09 NOTE — Telephone Encounter (Signed)
Byrd Hesselbach with [employer] with critical EKG results

## 2023-10-09 NOTE — Telephone Encounter (Signed)
Tim [name] with [employer] called in critical result from Day 3 (of 30 Days) on [employer] heart monitor.   Spoke to pt regarding VTach episode that lasted 8.5 sec today at 1450. He states that he was working in his garage and did feel SOB and palpitations that lasted for around 15-20 minutes. He had to stop what he was doing to recover.  He states, "This is the same thing that I have been feeling for a while now."  He also informed me that he will be home from work for the next week and a half. 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     Will f/u with DOD on 10/10/2023 in am.

## 2023-10-10 ENCOUNTER — Other Ambulatory Visit: Payer: Self-pay | Admitting: *Deleted

## 2023-10-10 ENCOUNTER — Ambulatory Visit (HOSPITAL_BASED_OUTPATIENT_CLINIC_OR_DEPARTMENT_OTHER)
Admission: RE | Admit: 2023-10-10 | Discharge: 2023-10-10 | Disposition: A | Discharge status: Home / Self Care | Payer: 59 | Source: Ambulatory Visit | Attending: Cardiovascular Disease | Admitting: Cardiovascular Disease

## 2023-10-10 DIAGNOSIS — I4891 Unspecified atrial fibrillation: Secondary | ICD-10-CM

## 2023-10-10 DIAGNOSIS — I10 Essential (primary) hypertension: Secondary | ICD-10-CM

## 2023-10-10 DIAGNOSIS — R079 Chest pain, unspecified: Secondary | ICD-10-CM | POA: Insufficient documentation

## 2023-10-10 MED ORDER — METOPROLOL SUCCINATE ER 25 MG PO TB24: 25.0000 mg | ORAL_TABLET | Freq: Every day | ORAL | 1 refills | Status: DC

## 2023-10-10 NOTE — Telephone Encounter (Signed)
Spoke to Dr. Tresa Endo, DOD of the day. Order to give metoprolol succinate 25 mg daily and schedule appt. for patient. Appt schedule with Marijean Niemann 11/26 @2 :15. Patient advised to check blood pressure and bring log to next schedule visit. Patient also advised to call office for any other symptoms

## 2023-10-10 NOTE — Telephone Encounter (Signed)
Spoke to patient and he reports he is doing good. Patient reports he has not had any more symptoms. He reports he still wearing his monitor and has not had any symptoms while working in garage. Advised patient to call office if symptoms resume.

## 2023-10-11 NOTE — Progress Notes (Unsigned)
Cardiology Office Note    Date:  10/14/2023  ID:  Tim [name], DOB 1966-05-19, MRN [id number] PCP:  Venetia Constable, MD  Cardiologist:  Nanetta Batty, MD  Electrophysiologist:  None   Chief Complaint: Follow up   History of Present Illness: .    Tim [name] is a 56 y.o. male with visit-pertinent history of paroxysmal atrial flutter on Eliquis, hypertension, obstructive sleep apnea, obesity, tobacco abuse and alcohol abuse.  Patient was first seen by cardiology during admission in 03/2023 for new onset atrial flutter with RVR.  He was placed on IV diltiazem and converted to sinus rhythm.  Echo showed LVEF of 45 to 50% with global hypokinesis and mild dilatation of the ascending aorta measuring 40 mm.  He was started on Eliquis with plans to continue this for 1 month and then stop given low CHA2DSV2- VASc score of 1 for hypertension.   Repeat echocardiogram on 07/17/2023 indicated LVEF of 50 to 55%, LV with low normal function, LV with no RWMA, normal diastolic parameters, there was mild dilatation of the ascending aorta measuring 41 mm.  Tim [name] was last seen by Dr. Allyson Sabal on 10/01/2023 follow-up.  He reported some atypical chest pain 3 weeks prior to visit that lasted for 2 days and was characterized as squeezing and resolved spontaneously.  He reported episodes of dizziness for unclear reasons, was recommended that he wear a 30-day ZIO monitor to further evaluate.  On 10/09/2023 [employer] notified the office of a critical result, patient had episode of V. tach that lasted 8.5 seconds.  Our office contacted the patient he stated that he was working in his garage and he did feel short of breath with palpitations. He noted that his symptoms were similar to what he has been intermittently feeling.  His results were reviewed with DOD at [facility] that day Dr. Tresa Endo, he was started on metoprolol succinate 25 mg daily.  Today patient presents regarding episode of V. Tach noted on  cardiac monitor.  He reports that he really was not aware of the episode.  He reported that he had been working in his garage and was overall unaware of arrhythmia until our office called him.  He notes that he did not receive the prescription for metoprolol succinate although on review of his home blood pressure and heart rate log his heart rate is normally in the low 60s in upper 50s he notes he has had bradycardia in the past.  He reports that he stopped drinking alcohol a month ago and has reduced his caffeine intake.  He has started increasing his exercise and has noticed improvement in his shortness of breath with this.  He denies chest pain, lower extremity edema, orthopnea or PND, presyncope or syncope.  ROS: .   Today he denies chest pain, lower extremity edema, fatigue, palpitations, melena, hematuria, hemoptysis, diaphoresis, weakness, presyncope, syncope, orthopnea, and PND.  All other systems are reviewed and otherwise negative. Studies Reviewed: Marland Kitchen    EKG:  EKG is ordered today, personally reviewed, demonstrating  EKG Interpretation Date/Time:  Tuesday October 14 2023 14:07:30 EST Ventricular Rate:  59 PR Interval:  172 QRS Duration:  104 QT Interval:  424 QTC Calculation: 419 R Axis:   55  Text Interpretation: Sinus bradycardia When compared with ECG of 01-Oct-2023 10:34, T wave inversion no longer evident in Inferior leads Confirmed by Reather Littler [phone]) on 10/14/2023 7:46:27 PM    CV Studies:  Cardiac Studies & Procedures  ECHOCARDIOGRAM  ECHOCARDIOGRAM COMPLETE 07/17/2023  Narrative ECHOCARDIOGRAM REPORT    Patient Name:   Tim [name]  Date of Exam: 07/17/2023 Medical Rec #:  [id number]     Height:       75.0 in Accession #:    [id number]    Weight:       317.6 lb Date of Birth:  [birth year]     BSA:          2.671 m Patient Age:    57 years      BP:           128/88 mmHg Patient Gender: M             HR:           49 bpm. Exam Location:  [address]  Procedure: 2D Echo, Cardiac Doppler, Color Doppler, 3D Echo and Strain Analysis  Indications:    I51.89 Left ventricle dysfunction  History:        Patient has prior history of Echocardiogram examinations, most recent 04/09/2023. Arrythmias:Atrial Fibrillation and Atrial Flutter; Risk Factors:Sleep Apnea and Current Smoker. Obesity.  Sonographer:    Cathie Beams RCS Referring Phys: [id number] CALLIE E GOODRICH  IMPRESSIONS   1. Left ventricular ejection fraction, by estimation, is 50 to 55%. The left ventricle has low normal function. The left ventricle has no regional wall motion abnormalities. The left ventricular internal cavity size was mildly dilated. Left ventricular diastolic parameters were normal. The average left ventricular global longitudinal strain is -20.9 %. The global longitudinal strain is normal. 2. Right ventricular systolic function is normal. The right ventricular size is normal. 3. The mitral valve is normal in structure. Trivial mitral valve regurgitation. No evidence of mitral stenosis. 4. The aortic valve is tricuspid. Aortic valve regurgitation is not visualized. Aortic valve sclerosis is present, with no evidence of aortic valve stenosis. 5. Aortic dilatation noted. There is mild dilatation of the ascending aorta, measuring 41 mm. 6. The inferior vena cava is normal in size with greater than 50% respiratory variability, suggesting right atrial pressure of 3 mmHg.  FINDINGS Left Ventricle: Left ventricular ejection fraction, by estimation, is 50 to 55%. The left ventricle has low normal function. The left ventricle has no regional wall motion abnormalities. The average left ventricular global longitudinal strain is -20.9 %. The global longitudinal strain is normal. The left ventricular internal cavity size was mildly dilated. There is no left ventricular hypertrophy. Left ventricular diastolic parameters were normal.  Right Ventricle: The right ventricular  size is normal. Right ventricular systolic function is normal.  Left Atrium: Left atrial size was normal in size.  Right Atrium: Right atrial size was normal in size.  Pericardium: There is no evidence of pericardial effusion.  Mitral Valve: The mitral valve is normal in structure. Trivial mitral valve regurgitation. No evidence of mitral valve stenosis.  Tricuspid Valve: The tricuspid valve is normal in structure. Tricuspid valve regurgitation is trivial. No evidence of tricuspid stenosis.  Aortic Valve: The aortic valve is tricuspid. Aortic valve regurgitation is not visualized. Aortic valve sclerosis is present, with no evidence of aortic valve stenosis.  Pulmonic Valve: The pulmonic valve was normal in structure. Pulmonic valve regurgitation is not visualized. No evidence of pulmonic stenosis.  Aorta: Aortic dilatation noted. There is mild dilatation of the ascending aorta, measuring 41 mm.  Venous: The inferior vena cava is normal in size with greater than 50% respiratory variability, suggesting right atrial pressure of 3 mmHg.  IAS/Shunts: No  atrial level shunt detected by color flow Doppler.   LEFT VENTRICLE PLAX 2D LVIDd:         5.80 cm   Diastology LVIDs:         4.00 cm   LV e' medial:    9.15 cm/s LV PW:         1.00 cm   LV E/e' medial:  12.2 LV IVS:        1.10 cm   LV e' lateral:   9.98 cm/s LVOT diam:     2.50 cm   LV E/e' lateral: 11.2 LV SV:         118 LV SV Index:   44        2D Longitudinal Strain LVOT Area:     4.91 cm  2D Strain GLS Avg:     -20.9 %  3D Volume EF: 3D EF:        63 % LV EDV:       317 ml LV ESV:       116 ml LV SV:        201 ml  RIGHT VENTRICLE RV Basal diam:  4.70 cm RV S prime:     15.30 cm/s TAPSE (M-mode): 3.9 cm RVSP:           24.2 mmHg  LEFT ATRIUM             Index        RIGHT ATRIUM           Index LA diam:        4.90 cm 1.83 cm/m   RA Pressure: 3.00 mmHg LA Vol (A2C):   70.2 ml 26.28 ml/m  RA Area:     20.80  cm LA Vol (A4C):   58.8 ml 22.01 ml/m  RA Volume:   59.30 ml  22.20 ml/m LA Biplane Vol: 66.3 ml 24.82 ml/m AORTIC VALVE LVOT Vmax:   105.00 cm/s LVOT Vmean:  63.000 cm/s LVOT VTI:    0.240 m  AORTA Ao Root diam: 3.60 cm Ao Asc diam:  4.10 cm  MITRAL VALVE                TRICUSPID VALVE MV Area (PHT): 4.46 cm     TR Peak grad:   21.2 mmHg MV Decel Time: 170 msec     TR Vmax:        230.00 cm/s MV E velocity: 112.00 cm/s  Estimated RAP:  3.00 mmHg MV A velocity: 63.40 cm/s   RVSP:           24.2 mmHg MV E/A ratio:  1.77 SHUNTS Systemic VTI:  0.24 m Systemic Diam: 2.50 cm  Olga Millers MD Electronically signed by Olga Millers MD Signature Date/Time: 07/17/2023/1:22:40 PM    Final               Current Reported Medications:.    Current Meds  Medication Sig   amLODipine (NORVASC) 5 MG tablet Take 5 mg by mouth daily.   Ascorbic Acid (VITAMIN C PO) Take 1 tablet by mouth daily.   b complex vitamins tablet Take 1 tablet by mouth daily.   Budeson-Glycopyrrol-Formoterol (BREZTRI AEROSPHERE) 160-9-4.8 MCG/ACT AERO Inhale into the lungs as needed.   cholecalciferol (VITAMIN D) 1000 UNITS tablet Take 1,000 Units by mouth daily.   dicyclomine (BENTYL) 20 MG tablet Take 20 mg by mouth 2 (two) times daily.   doxazosin (CARDURA XL) 4 MG 24 hr tablet Take  4 mg by mouth daily with breakfast.   lisinopril-hydrochlorothiazide (ZESTORETIC) 20-25 MG tablet Take 1 tablet by mouth daily.   montelukast (SINGULAIR) 10 MG tablet Take 10 mg by mouth daily.   sildenafil (REVATIO) 20 MG tablet Take 20 mg by mouth 3 (three) times daily as needed.   testosterone cypionate (DEPOTESTOSTERONE CYPIONATE) 200 MG/ML injection Inject 200 mg into the muscle as needed. PATIENT INJECTS 1/2 ML EVERY 10 DAYS   [DISCONTINUED] metoprolol succinate (TOPROL XL) 25 MG 24 hr tablet Take 1 tablet (25 mg total) by mouth daily.   Physical Exam:    VS:  BP 118/60 (BP Location: Left Arm, Patient Position:  Sitting, Cuff Size: Large)   Pulse (!) 59   Ht 6\' 3"  (1.905 m)   Wt (!) 308 lb (139.7 kg)   BMI 38.50 kg/m    Wt Readings from Last 3 Encounters:  10/14/23 (!) 308 lb (139.7 kg)  10/01/23 (!) 321 lb (145.6 kg)  04/17/23 (!) 317 lb 9.6 oz (144.1 kg)    GEN: Well nourished, well developed in no acute distress NECK: No JVD; No carotid bruits CARDIAC: RRR, no murmurs, rubs, gallops RESPIRATORY:  Clear to auscultation without rales, wheezing or rhonchi  ABDOMEN: Soft, non-tender, non-distended EXTREMITIES:  No edema; No acute deformity   Asessement and Plan:.    Vtach/Paroxysmal atrial flutter: Admitted in 03/2023 for new onset atrial flutter with RVR, converted back to sinus rhythm with IV diltiazem.  Echo showed LVEF 45 to 50% with global hypokinesis, repeat echo in 06/2023 indicated LVEF of 50 to 55%. EKG today indicates sinus bradycardia at 59 bpm.  Cardiac monitor noted an episode of ventricular tachycardia lasting 8.5 seconds, patient reports that he was overall asymptomatic and unaware of arrhythmia.  Patient reports that he has had bradycardia in the past, he did not receive prescription of metoprolol succinate.  On review of his home blood pressure logs his heart rate is consistently in the 60s and occasionally in the 50s.  Given being overall unaware of episode and low normal heart rates will defer starting on metoprolol until final results of monitor are available. Check BMET and Mag level.    Mild LV dysfunction: Echo in 03/2023 LVEF of 45 to 50% with global hypokinesis, repeat echo on 07/17/23 indicated LVEF of 50 to 55%, LV with low normal function, LV with no RWMA, normal diastolic parameters. Today he appears euvolemic and well compensated on exam. He notes improvement in his breathing since he has started increasing his exercise.  He denies lower extremity edema, orthopnea or PND.  Hypertension: Blood pressure today 118/60.  Blood pressures at home per his blood pressure log have been  consistently less than 120/80. Continue current antihypertensive regimen   OSA: Patient reports compliance with CPAP device.  Hyperlipidemia: Last lipid profile on 07/03/2023 showed total cholesterol 177, HDL 45 and LDL 120. He had coronary calcium scoring completed last week, unfortunately results are not available for review yet.    Disposition: F/u with Dr. Allyson Sabal pending monitor results and coronary calcium scoring.   Signed, Rip Harbour, NP

## 2023-10-14 ENCOUNTER — Ambulatory Visit: Payer: 59 | Attending: Cardiology | Admitting: Cardiology

## 2023-10-14 ENCOUNTER — Encounter: Payer: Self-pay | Admitting: Cardiology

## 2023-10-14 VITALS — BP 118/60 | HR 59 | Ht 75.0 in | Wt 308.0 lb

## 2023-10-14 DIAGNOSIS — I10 Essential (primary) hypertension: Secondary | ICD-10-CM | POA: Diagnosis not present

## 2023-10-14 DIAGNOSIS — G4733 Obstructive sleep apnea (adult) (pediatric): Secondary | ICD-10-CM

## 2023-10-14 DIAGNOSIS — I472 Ventricular tachycardia, unspecified: Secondary | ICD-10-CM

## 2023-10-14 DIAGNOSIS — I4891 Unspecified atrial fibrillation: Secondary | ICD-10-CM | POA: Diagnosis not present

## 2023-10-14 DIAGNOSIS — E782 Mixed hyperlipidemia: Secondary | ICD-10-CM

## 2023-10-14 NOTE — Patient Instructions (Addendum)
Medication Instructions:  No changes *If you need a refill on your cardiac medications before your next appointment, please call your pharmacy*  Lab Work: Today we will draw Bmet and Mag If you have labs (blood work) drawn today and your tests are completely normal, you will receive your results only by: MyChart Message (if you have MyChart) OR A paper copy in the mail If you have any lab test that is abnormal or we need to change your treatment, we will call you to review the results.  Testing/Procedures: No testing  Follow-Up: At [facility], you and your health needs are our priority.  As part of our continuing mission to provide you with exceptional heart care, we have created designated Provider Care Teams.  These Care Teams include your primary Cardiologist (physician) and Advanced Practice Providers (APPs -  Physician Assistants and Nurse Practitioners) who all work together to provide you with the care you need, when you need it.  We recommend signing up for the patient portal called "MyChart".  Sign up information is provided on this After Visit Summary.  MyChart is used to connect with patients for Virtual Visits (Telemedicine).  Patients are able to view lab/test results, encounter notes, upcoming appointments, etc.  Non-urgent messages can be sent to your provider as well.   To learn more about what you can do with MyChart, go to [email or web address].    Your next appointment:   6 month(s)  Provider:   Marjie Skiff, PA-C

## 2023-10-15 LAB — BASIC METABOLIC PANEL
BUN/Creatinine Ratio: 16 (ref 9–20)
BUN: 15 mg/dL (ref 6–24)
CO2: 22 mmol/L (ref 20–29)
Calcium: 9.3 mg/dL (ref 8.7–10.2)
Chloride: 97 mmol/L (ref 96–106)
Creatinine, Ser: 0.93 mg/dL (ref 0.76–1.27)
Glucose: 77 mg/dL (ref 70–99)
Potassium: 4.1 mmol/L (ref 3.5–5.2)
Sodium: 137 mmol/L (ref 134–144)
eGFR: 95 mL/min/{1.73_m2} (ref >59)

## 2023-10-15 LAB — MAGNESIUM: Magnesium: 2.1 mg/dL (ref 1.6–2.3)

## 2023-10-27 ENCOUNTER — Ambulatory Visit: Payer: 59 | Admitting: Cardiovascular Disease

## 2023-10-31 ENCOUNTER — Other Ambulatory Visit (HOSPITAL_BASED_OUTPATIENT_CLINIC_OR_DEPARTMENT_OTHER): Payer: Self-pay

## 2023-10-31 DIAGNOSIS — E782 Mixed hyperlipidemia: Secondary | ICD-10-CM

## 2023-10-31 DIAGNOSIS — R931 Abnormal findings on diagnostic imaging of heart and coronary circulation: Secondary | ICD-10-CM

## 2023-10-31 MED ORDER — ATORVASTATIN CALCIUM 40 MG PO TABS: 40.0000 mg | ORAL_TABLET | Freq: Every day | ORAL | 3 refills | Status: DC

## 2023-11-07 ENCOUNTER — Telehealth: Payer: Self-pay | Admitting: Cardiovascular Disease

## 2023-11-07 NOTE — Telephone Encounter (Signed)
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

## 2023-11-07 NOTE — Telephone Encounter (Signed)
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 Please advise

## 2023-11-08 ENCOUNTER — Encounter: Payer: Self-pay | Admitting: Cardiovascular Disease

## 2023-11-10 NOTE — Telephone Encounter (Signed)
See mychart encounter for more details. Closing this encounter.

## 2023-11-13 ENCOUNTER — Ambulatory Visit: Payer: 59 | Attending: Cardiovascular Disease

## 2023-11-13 DIAGNOSIS — I4891 Unspecified atrial fibrillation: Secondary | ICD-10-CM

## 2023-11-26 NOTE — Telephone Encounter (Signed)
 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

## 2024-05-14 ENCOUNTER — Other Ambulatory Visit: Payer: Self-pay | Admitting: Neurosurgery

## 2024-05-14 DIAGNOSIS — M5416 Radiculopathy, lumbar region: Secondary | ICD-10-CM

## 2024-05-17 ENCOUNTER — Encounter: Payer: Self-pay | Admitting: Neurosurgery

## 2024-05-30 ENCOUNTER — Ambulatory Visit
Admission: RE | Admit: 2024-05-30 | Discharge: 2024-05-30 | Disposition: A | Discharge status: Home / Self Care | Source: Ambulatory Visit | Attending: Neurosurgery

## 2024-05-30 DIAGNOSIS — M5416 Radiculopathy, lumbar region: Secondary | ICD-10-CM

## 2024-06-01 ENCOUNTER — Telehealth: Payer: Self-pay | Admitting: Cardiovascular Disease

## 2024-06-01 ENCOUNTER — Ambulatory Visit (HOSPITAL_COMMUNITY)
Admission: RE | Admit: 2024-06-01 | Discharge: 2024-06-01 | Disposition: A | Discharge status: Home / Self Care | Source: Ambulatory Visit | Attending: Internal Medicine | Admitting: Internal Medicine

## 2024-06-01 VITALS — BP 130/82 | HR 47 | Ht 75.0 in | Wt 287.8 lb

## 2024-06-01 DIAGNOSIS — I4892 Unspecified atrial flutter: Secondary | ICD-10-CM | POA: Diagnosis not present

## 2024-06-01 DIAGNOSIS — R079 Chest pain, unspecified: Secondary | ICD-10-CM | POA: Diagnosis not present

## 2024-06-01 DIAGNOSIS — I4891 Unspecified atrial fibrillation: Secondary | ICD-10-CM | POA: Diagnosis not present

## 2024-06-01 NOTE — Progress Notes (Addendum)
 Primary Care Physician: Marsa Miquel Faden, MD Primary Cardiologist: Dorn Lesches, MD Electrophysiologist: None     Referring Physician: Dr. Lesches Tim [name] is a 58 y.o. male with a history of HTN, HLD, OSA on CPAP, NSVT, obesity, tobacco abuse, alcohol abuse, and atrial flutter who presents for consultation in the [facility]. Patient called clinic on 7/15 noting low HR unclear if Afib. Patient is on Eliquis  for a CHADS2VASC score of at least 1.  On evaluation today, patient is currently in NSR. He felt a left sided chest pain with radiation to left arm yesterday evening after dinner (6:00 pm). He went to the garage and noted the pain which has subsided. He has not noted any elevated HR and no palpitations similar to when he had atrial flutter. He notes his low HR as a concern which he was told by his Apple watch to be in a low HR since yesterday. Review of records show similar HR when seen by Dr. Lesches in November.   Today, he denies symptoms of shortness of breath, orthopnea, PND, lower extremity edema, dizziness, presyncope, syncope, snoring, daytime somnolence, bleeding, or neurologic sequela. The patient is tolerating medications without difficulties and is otherwise without complaint today.    Atrial Fibrillation Risk Factors:  he does have symptoms or diagnosis of sleep apnea. he is compliant with CPAP therapy.   he has a BMI of Body mass index is 35.97 kg/m.SABRA Filed Weights   06/01/24 1115  Weight: 130.5 kg    Current Outpatient Medications  Medication Sig Dispense Refill   albuterol (VENTOLIN HFA) 108 (90 Base) MCG/ACT inhaler Inhale 2 puffs into the lungs as needed.     amLODipine (NORVASC) 5 MG tablet Take 5 mg by mouth daily.     Ascorbic Acid (VITAMIN C PO) Take 1 tablet by mouth daily.     Azelastine-Fluticasone 137-50 MCG/ACT SUSP Place 1 spray into both nostrils 2 (two) times daily.     b complex vitamins tablet Take 1  tablet by mouth daily.     cholecalciferol (VITAMIN D) 1000 UNITS tablet Take 1,000 Units by mouth daily. (Patient taking differently: Take 5,000 Units by mouth daily.)     dicyclomine (BENTYL) 20 MG tablet Take 20 mg by mouth 2 (two) times daily. (Patient taking differently: Take 20 mg by mouth as needed.)     doxazosin (CARDURA XL) 4 MG 24 hr tablet Take 4 mg by mouth daily with breakfast.     lisinopril-hydrochlorothiazide (ZESTORETIC) 20-25 MG tablet Take 1 tablet by mouth daily.     meloxicam (MOBIC) 15 MG tablet Take 15 mg by mouth every morning.     tadalafil (CIALIS) 10 MG tablet Take 10 mg by mouth as needed.     testosterone cypionate (DEPOTESTOSTERONE CYPIONATE) 200 MG/ML injection Inject 200 mg into the muscle as needed. PATIENT INJECTS 1/2 ML EVERY 10 DAYS     No current facility-administered medications for this encounter.    Atrial Fibrillation Management history:  Previous antiarrhythmic drugs: none Previous cardioversions: none Previous ablations: none Anticoagulation history: Eliquis    ROS- All systems are reviewed and negative except as per the HPI above.  Physical Exam: BP 130/82   Pulse (!) 47   Ht 6' 3 (1.905 m)   Wt 130.5 kg   BMI 35.97 kg/m   GEN: Well nourished, well developed in no acute distress NECK: No JVD; No carotid bruits CARDIAC: Regular bradycardic rate and rhythm, no  murmurs, rubs, gallops RESPIRATORY:  Clear to auscultation without rales, wheezing or rhonchi  ABDOMEN: Soft, non-tender, non-distended EXTREMITIES:  No edema; No deformity   EKG today demonstrates  Vent. rate 47 BPM PR interval 166 ms QRS duration 102 ms QT/QTcB 444/392 ms P-R-T axes 45 37 51 Sinus bradycardia Otherwise normal ECG When compared with ECG of 14-Oct-2023 14:07, No significant change was found  Echo 07/17/23 demonstrated  1. Left ventricular ejection fraction, by estimation, is 50 to 55%. The  left ventricle has low normal function. The left ventricle has  no regional  wall motion abnormalities. The left ventricular internal cavity size was  mildly dilated. Left ventricular  diastolic parameters were normal. The average left ventricular global  longitudinal strain is -20.9 %. The global longitudinal strain is normal.   2. Right ventricular systolic function is normal. The right ventricular  size is normal.   3. The mitral valve is normal in structure. Trivial mitral valve  regurgitation. No evidence of mitral stenosis.   4. The aortic valve is tricuspid. Aortic valve regurgitation is not  visualized. Aortic valve sclerosis is present, with no evidence of aortic  valve stenosis.   5. Aortic dilatation noted. There is mild dilatation of the ascending  aorta, measuring 41 mm.   6. The inferior vena cava is normal in size with greater than 50%  respiratory variability, suggesting right atrial pressure of 3 mmHg.    ASSESSMENT & PLAN CHA2DS2-VASc Score = 1  The patient's score is based upon: CHF History: 0 HTN History: 1 Diabetes History: 0 Stroke History: 0 Vascular Disease History: 0 Age Score: 0 Gender Score: 0       ASSESSMENT AND PLAN: Paroxysmal Atrial Flutter (ICD10:  I48.0) The patient's CHA2DS2-VASc score is 1, indicating a 0.6% annual risk of stroke.    Chest pain  Patient is currently in NSR. He has marked sinus bradycardia today. He is not out of rhythm. I do not have indication from his ECG today to support myocardial infarction, arrhythmia, or AV block. He appears to have a similar ECG to previous with similar bradycardia. He is not on an AV nodal Tim at this time. We discussed potential for repeat cardiac monitor but at this time will not repeat. He has follow up with his primary cardiologist and recommended to keep appointment. ED precautions discussed about chest pain if it were to recur.    Follow up with Dr. Court as scheduled. Follow up Afib clinic prn.   Terra Pac, PA-C  Afib Clinic [facility] [address] [place], [place] [address] [phone]

## 2024-06-01 NOTE — Telephone Encounter (Signed)
 Pt scheduled to see the afib clinic this morning, directions given to patient and sent via mychart.   Will notify afib clinic staff of add-on.

## 2024-06-01 NOTE — Telephone Encounter (Signed)
 Pt noticed low HR yesterday evening, around 6pm, with some slight CP/arm pain associated.  States HR currently is 58, fell below 40 bpm overnight.  Does not feel like  his heart is racing and his watch says his HR is too low to detect if afib or not.  States he worked yesterday and felt great.  No other symptoms associated with low HRs. Scheduler scheduled pt to be seen end of the month with Dr. Court.  Informed pt that I would forward to Dr. Court for review/advisement.  Informed that I can schedule him on Friday in the afib clinic if Dr. Court would like him seen sooner.  Patient aware office will call back once MD advises.  Advised to go to ED if symptoms worsen.  Patient verbalized understanding and agreeable to plan.

## 2024-06-01 NOTE — Telephone Encounter (Signed)
 STAT if HR is under 50 or over 120 (normal HR is 60-100 beats per minute)  What is your heart rate? 40-50  Do you have a log of your heart rate readings (document readings)?   No  Do you have any other symptoms?   A little bit of chest pain, pain in his arm  Patient stated he wears an Apple watch and it shows his HR has been 40-50

## 2024-06-16 ENCOUNTER — Ambulatory Visit: Attending: Cardiology | Admitting: Cardiovascular Disease

## 2024-06-16 ENCOUNTER — Encounter: Payer: Self-pay | Admitting: Cardiovascular Disease

## 2024-06-16 ENCOUNTER — Telehealth: Payer: Self-pay | Admitting: Pharmacist

## 2024-06-16 VITALS — BP 120/70 | HR 60 | Ht 74.0 in | Wt 290.0 lb

## 2024-06-16 DIAGNOSIS — R931 Abnormal findings on diagnostic imaging of heart and coronary circulation: Secondary | ICD-10-CM | POA: Diagnosis not present

## 2024-06-16 DIAGNOSIS — E782 Mixed hyperlipidemia: Secondary | ICD-10-CM

## 2024-06-16 DIAGNOSIS — G4733 Obstructive sleep apnea (adult) (pediatric): Secondary | ICD-10-CM | POA: Diagnosis not present

## 2024-06-16 DIAGNOSIS — I4891 Unspecified atrial fibrillation: Secondary | ICD-10-CM

## 2024-06-16 DIAGNOSIS — R079 Chest pain, unspecified: Secondary | ICD-10-CM

## 2024-06-16 NOTE — Assessment & Plan Note (Signed)
 History of PAF 04/09/2023 when he converted pharmacologically.  He was placed on Eliquis  for 30 days which was discontinued.  2D echo was normal.  He had an event monitor that showed no evidence of A-fib.

## 2024-06-16 NOTE — Assessment & Plan Note (Signed)
 Coronary calcium  score performed 10/10/2023 was 125 distributed in all 3 coronary arteries.  He does have occasional atypical chest pain.  He had negative stress echo at [facility] 07/15/2022.  His LDL recently performed was 99 intolerant to statin therapy.  I am going to begin him on a PCSK9 with LDL goal less than 70.

## 2024-06-16 NOTE — Telephone Encounter (Signed)
 In room consult after appt with Dr Court regarding Repatha . Educated on mechanism of action, storage, site selection, administration and possible adverse effects. Patient voiced understanding, was formerly on Wegovy. Will complete PA and contact patient with result.

## 2024-06-16 NOTE — Assessment & Plan Note (Signed)
 History of obstructive sleep apnea on CPAP.

## 2024-06-16 NOTE — Patient Instructions (Signed)
 Medication Instructions:  Your physician recommends that you continue on your current medications as directed. Please refer to the Current Medication list given to you today.  *If you need a refill on your cardiac medications before your next appointment, please call your pharmacy*   Follow-Up: At [facility], you and your health needs are our priority.  As part of our continuing mission to provide you with exceptional heart care, our providers are all part of one team.  This team includes your primary Cardiologist (physician) and Advanced Practice Providers or APPs (Physician Assistants and Nurse Practitioners) who all work together to provide you with the care you need, when you need it.  Your next appointment:   6 month(s)  Provider:   Marcie Sever, PA-C, Callie Goodrich, PA-C, Kathleen Johnson, PA-C, Marlana Silvan, NP, or Katlyn West, NP         Then, Lauro Portal, MD will plan to see you again in 12 month(s).     We recommend signing up for the patient portal called MyChart.  Sign up information is provided on this After Visit Summary.  MyChart is used to connect with patients for Virtual Visits (Telemedicine).  Patients are able to view lab/test results, encounter notes, upcoming appointments, etc.  Non-urgent messages can be sent to your provider as well.   To learn more about what you can do with MyChart, go to [email or web address].

## 2024-06-16 NOTE — Progress Notes (Signed)
 06/16/2024 Tim [name]   Sep 14, 1966  [id number]  Primary Physician Marsa, Miquel Faden, MD Primary Cardiologist: Dorn JINNY Lesches MD [place] [place], [place]  HPI:  Tim [name] is a 58 y.o.  workmates married Caucasian male father of 2 stepchildren, grandfather of 3 grandchildren who is accompanied by his wife Tim [name] today. I last saw him in the office 10/01/2023.  When he came in with A-fib with RVR and converted pharmacologically. He is placed on Eliquis  for 30 days which was since discontinued. 2D echo at that time was normal. His other problems include treated hypertension and untreated hyperlipidemia. He smoked 20 pack years and stopped 20 years ago. There is no family history of heart disease. Never had a heart attack or stroke. He has had some atypical chest pain 3 weeks ago which lasted for 2 days and was characterized as squeezing and resolved spontaneously. Is also had a fleeting episode of substernal chest burning which lasted less than 10 seconds. He does wear an Apple Watch which consistently says that his A-fib burden is less than 2%. He feels episodes of dizziness for unclear reasons. He does continue to drink alcohol particularly in the weekends.  Since I saw him 8 months ago I did get a coronary calcium  score 10/10/2023 which was 125 distributed in all 3 coronary arteries.  A event monitor performed 11/13/2023 showed runs of nonsustained VT but no A-fib.  He does complain of occasional infrequent chest pain that some can sometimes last hours at a time.  He did have a negative stress echo at [facility] 07/15/2022.  In addition, he has obstructive sleep apnea on CPAP.   Current Meds  Medication Sig   albuterol (VENTOLIN HFA) 108 (90 Base) MCG/ACT inhaler Inhale 2 puffs into the lungs as needed.   amLODipine (NORVASC) 5 MG tablet Take 5 mg by mouth daily.   Ascorbic Acid (VITAMIN C PO) Take 1 tablet by mouth daily.   Azelastine-Fluticasone 137-50 MCG/ACT SUSP Place 1 spray into  both nostrils 2 (two) times daily.   b complex vitamins tablet Take 1 tablet by mouth daily.   cholecalciferol (VITAMIN D) 1000 UNITS tablet Take 1,000 Units by mouth daily. (Patient taking differently: Take 5,000 Units by mouth daily.)   dicyclomine (BENTYL) 20 MG tablet Take 20 mg by mouth 2 (two) times daily. (Patient taking differently: Take 20 mg by mouth as needed.)   doxazosin (CARDURA XL) 4 MG 24 hr tablet Take 4 mg by mouth daily with breakfast.   lisinopril-hydrochlorothiazide (ZESTORETIC) 20-25 MG tablet Take 1 tablet by mouth daily.   meloxicam (MOBIC) 15 MG tablet Take 15 mg by mouth every morning.   tadalafil (CIALIS) 10 MG tablet Take 10 mg by mouth as needed.   testosterone cypionate (DEPOTESTOSTERONE CYPIONATE) 200 MG/ML injection Inject 200 mg into the muscle as needed. PATIENT INJECTS 1/2 ML EVERY 10 DAYS     No Known Allergies  Social History   Socioeconomic History   Marital status: Single    Spouse name: Not on file   Number of children: Not on file   Years of education: Not on file   Highest education level: Not on file  Occupational History   Not on file  Tobacco Use   Smoking status: Former    Types: Cigars   Smokeless tobacco: Not on file  Substance and Sexual Activity   Alcohol use: Yes    Comment: occ   Drug use: No   Sexual activity: Not on  file  Other Topics Concern   Not on file  Social History Narrative   Not on file   Social Drivers of Health   Financial Resource Strain: Patient Declined (04/07/2024)   Received from [facility]   Overall Financial Resource Strain (CARDIA)    Difficulty of Paying Living Expenses: Patient declined  Food Insecurity: Patient Declined (04/07/2024)   Received from [facility]   Hunger Vital Sign    Within the past 12 months, you worried that your food would run out before you got the money to buy more.: Patient declined    Within the past 12 months, the food you bought just didn't last and you didn't have  money to get more.: Patient declined  Transportation Needs: Patient Declined (04/07/2024)   Received from [facility] - Transportation    Lack of Transportation (Medical): Patient declined    Lack of Transportation (Non-Medical): Patient declined  Physical Activity: Unknown (04/07/2024)   Received from [facility]   Exercise Vital Sign    On average, how many days per week do you engage in moderate to strenuous exercise (like a brisk walk)?: Patient declined    Minutes of Exercise per Session: Not on file  Stress: Patient Declined (04/07/2024)   Received from [facility] - Occupational Stress Questionnaire    Feeling of Stress : Patient declined  Social Connections: Patient Declined (04/07/2024)   Received from [facility]   Social Network    How would you rate your social network (family, work, friends)?: Patient declined  Intimate Partner Violence: Patient Declined (04/07/2024)   Received from [facility]   HITS    Over the last 12 months how often did your partner physically hurt you?: Patient declined    Over the last 12 months how often did your partner insult you or talk down to you?: Patient declined    Over the last 12 months how often did your partner threaten you with physical harm?: Patient declined    Over the last 12 months how often did your partner scream or curse at you?: Patient declined     Review of Systems: General: negative for chills, fever, night sweats or weight changes.  Cardiovascular: negative for chest pain, dyspnea on exertion, edema, orthopnea, palpitations, paroxysmal nocturnal dyspnea or shortness of breath Dermatological: negative for rash Respiratory: negative for cough or wheezing Urologic: negative for hematuria Abdominal: negative for nausea, vomiting, diarrhea, bright red blood per rectum, melena, or hematemesis Neurologic: negative for visual changes, syncope, or dizziness All other  systems reviewed and are otherwise negative except as noted above.    Blood pressure 120/70, pulse 60, height 6' 2 (1.88 m), weight 290 lb (131.5 kg), SpO2 97%.  General appearance: alert and no distress Neck: no adenopathy, no carotid bruit, no JVD, supple, symmetrical, trachea midline, and thyroid  not enlarged, symmetric, no tenderness/mass/nodules Lungs: clear to auscultation bilaterally Heart: regular rate and rhythm, S1, S2 normal, no murmur, click, rub or gallop Extremities: extremities normal, atraumatic, no cyanosis or edema Pulses: 2+ and symmetric Skin: Skin color, texture, turgor normal. No rashes or lesions Neurologic: Grossly normal  EKG not performed today      ASSESSMENT AND PLAN:   OSA (obstructive sleep apnea) History of obstructive sleep apnea on CPAP  Atrial fibrillation with rapid ventricular response (HCC) History of PAF 04/09/2023 when he converted pharmacologically.  He was placed on Eliquis  for 30 days which was discontinued.  2D  echo was normal.  He had an event monitor that showed no evidence of A-fib.  Hyperlipidemia History of hyperlipidemia not on statin therapy with lipid profile performed by his PCP 06/08/2024 revealing total cholesterol 149, LDL of 99 HDL 41.  He was tried on atorvastatin  in the past but was intolerant to this.  I am going to begin him on a PCSK9.  LDL goal less than 70.  Elevated coronary artery calcium  score Coronary calcium  score performed 10/10/2023 was 125 distributed in all 3 coronary arteries.  He does have occasional atypical chest pain.  He had negative stress echo at [facility] 07/15/2022.  His LDL recently performed was 99 intolerant to statin therapy.  I am going to begin him on a PCSK9 with LDL goal less than 70.     Dorn DOROTHA Lesches MD FACP,FACC,FAHA, [facility] 06/16/2024 3:12 PM

## 2024-06-16 NOTE — Assessment & Plan Note (Signed)
 History of hyperlipidemia not on statin therapy with lipid profile performed by his PCP 06/08/2024 revealing total cholesterol 149, LDL of 99 HDL 41.  He was tried on atorvastatin  in the past but was intolerant to this.  I am going to begin him on a PCSK9.  LDL goal less than 70.

## 2024-06-17 ENCOUNTER — Telehealth: Payer: Self-pay | Admitting: Pharmacy Technician

## 2024-06-17 ENCOUNTER — Other Ambulatory Visit (HOSPITAL_COMMUNITY): Payer: Self-pay

## 2024-06-17 MED ORDER — REPATHA SURECLICK 140 MG/ML ~~LOC~~ SOAJ
1.0000 mL | SUBCUTANEOUS | 1 refills | Status: AC
Start: 2024-06-17 — End: ?

## 2024-06-17 NOTE — Telephone Encounter (Signed)
 From pt calls    No pa needed

## 2024-09-10 ENCOUNTER — Other Ambulatory Visit: Payer: Self-pay | Admitting: Medical Genetics

## 2024-09-10 DIAGNOSIS — Z006 Encounter for examination for normal comparison and control in clinical research program: Secondary | ICD-10-CM

## 2024-11-09 ENCOUNTER — Encounter: Payer: Self-pay | Admitting: Cardiovascular Disease
